# Patient Record
Sex: Female | Born: 1995 | Race: Black or African American | Hispanic: No | Marital: Single | Smoking: Former smoker
Health system: Southern US, Community
[De-identification: ages and names within clinical notes are randomized; demographics above are authoritative.]

## PROBLEM LIST (undated history)

## (undated) DIAGNOSIS — T7840XA Allergy, unspecified, initial encounter: Secondary | ICD-10-CM

## (undated) DIAGNOSIS — K219 Gastro-esophageal reflux disease without esophagitis: Secondary | ICD-10-CM

## (undated) DIAGNOSIS — B977 Papillomavirus as the cause of diseases classified elsewhere: Secondary | ICD-10-CM

## (undated) DIAGNOSIS — Z8742 Personal history of other diseases of the female genital tract: Secondary | ICD-10-CM

## (undated) DIAGNOSIS — Z789 Other specified health status: Secondary | ICD-10-CM

## (undated) HISTORY — DX: Allergy, unspecified, initial encounter: T78.40XA

## (undated) HISTORY — PX: NO PAST SURGERIES: SHX2092

## (undated) HISTORY — DX: Other specified health status: Z78.9

---
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
## 2017-01-06 ENCOUNTER — Ambulatory Visit: Payer: Self-pay | Admitting: Pediatrics

## 2017-03-13 ENCOUNTER — Ambulatory Visit: Payer: Self-pay | Admitting: Pediatrics

## 2017-03-27 ENCOUNTER — Emergency Department (HOSPITAL_COMMUNITY)
Admission: EM | Admit: 2017-03-27 | Discharge: 2017-03-27 | Disposition: A | Discharge status: Home / Self Care | Payer: Self-pay | Attending: Emergency Medicine | Admitting: Emergency Medicine

## 2017-03-27 ENCOUNTER — Encounter (HOSPITAL_COMMUNITY): Payer: Self-pay | Admitting: Emergency Medicine

## 2017-03-27 DIAGNOSIS — Y999 Unspecified external cause status: Secondary | ICD-10-CM | POA: Insufficient documentation

## 2017-03-27 DIAGNOSIS — Y939 Activity, unspecified: Secondary | ICD-10-CM | POA: Insufficient documentation

## 2017-03-27 DIAGNOSIS — W0110XA Fall on same level from slipping, tripping and stumbling with subsequent striking against unspecified object, initial encounter: Secondary | ICD-10-CM | POA: Insufficient documentation

## 2017-03-27 DIAGNOSIS — Z7722 Contact with and (suspected) exposure to environmental tobacco smoke (acute) (chronic): Secondary | ICD-10-CM | POA: Insufficient documentation

## 2017-03-27 DIAGNOSIS — Y929 Unspecified place or not applicable: Secondary | ICD-10-CM | POA: Insufficient documentation

## 2017-03-27 DIAGNOSIS — R55 Syncope and collapse: Secondary | ICD-10-CM | POA: Insufficient documentation

## 2017-03-27 DIAGNOSIS — S0181XA Laceration without foreign body of other part of head, initial encounter: Secondary | ICD-10-CM | POA: Insufficient documentation

## 2017-03-27 HISTORY — DX: Personal history of other diseases of the female genital tract: Z87.42

## 2017-03-27 LAB — I-STAT BETA HCG BLOOD, ED (MC, WL, AP ONLY): I-stat hCG, quantitative: <5 m[IU]/mL (ref <5)

## 2017-03-27 LAB — I-STAT CHEM 8, ED
BUN: 6 mg/dL (ref 6–20)
CALCIUM ION: 1.15 mmol/L (ref 1.15–1.40)
CREATININE: 0.7 mg/dL (ref 0.44–1.00)
Chloride: 105 mmol/L (ref 101–111)
GLUCOSE: 88 mg/dL (ref 65–99)
HCT: 34 % — ABNORMAL LOW (ref 36.0–46.0)
HEMOGLOBIN: 11.6 g/dL — AB (ref 12.0–15.0)
Potassium: 3.8 mmol/L (ref 3.5–5.1)
Sodium: 140 mmol/L (ref 135–145)
TCO2: 25 mmol/L (ref 22–32)

## 2017-03-27 MED ORDER — LIDOCAINE-EPINEPHRINE-TETRACAINE (LET) SOLUTION
3.0000 mL | Freq: Once | NASAL | Status: AC
  Administered 2017-03-27: 3 mL via TOPICAL
  Filled 2017-03-27: qty 3

## 2017-03-27 NOTE — ED Provider Notes (Signed)
[facility]-EMERGENCY DEPT Provider Note   CSN: [id number] Arrival date & time: 03/27/17  1557     History   Chief Complaint Chief Complaint  Patient presents with  . Loss of Consciousness    HPI Victoria Turner is a 21 y.o. female.  Patient had not eaten all day today became dizzy and passed out hit her chin.   The history is provided by the patient.  Loss of Consciousness   This is a new problem. The current episode started less than 1 hour ago. The problem occurs rarely. The problem has been resolved. She lost consciousness for a period of less than one minute. The problem is associated with normal activity. Associated symptoms include dizziness and visual change. Pertinent negatives include abdominal pain, back pain, chest pain, congestion, headaches and seizures.    Past Medical History:  Diagnosis Date  . Allergy    seasonal allergies  . History of heavy periods   . Medical history non-contributory     Patient Active Problem List   Diagnosis Date Noted  . UTI (lower urinary tract infection) 08/15/2015  . High risk sexual behavior 08/15/2015  . 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 Foster care (status) 01/12/2014  . Other seasonal allergic rhinitis 01/12/2014    History reviewed. No pertinent surgical history.  OB History    Gravida Para Term Preterm AB Living   0 0 0 0 0 0   SAB TAB Ectopic Multiple Live Births   0 0 0 0 0       Home Medications    Prior to Admission medications   Not on File    Family History Family History  Problem Relation Age of Onset  . Hypertension Mother   . Autism Sister   . Autism Brother   . Autism Sister   . Autism Sister   . ADD / ADHD Sister     Social History Social History  Substance Use Topics  . Smoking status: Passive Smoke Exposure - Never Smoker  . Smokeless tobacco: Never Used  . Alcohol use No     Allergies   Pollen extract   Review of Systems Review of Systems  Constitutional:  Negative for appetite change and fatigue.  HENT: Negative for congestion, ear discharge and sinus pressure.   Eyes: Negative for discharge.  Respiratory: Negative for cough.   Cardiovascular: Positive for syncope. Negative for chest pain.  Gastrointestinal: Negative for abdominal pain and diarrhea.  Genitourinary: Negative for frequency and hematuria.  Musculoskeletal: Negative for back pain.  Skin: Negative for rash.  Neurological: Positive for dizziness. Negative for seizures and headaches.  Psychiatric/Behavioral: Negative for hallucinations.     Physical Exam Updated Vital Signs BP 99/63   Pulse 87   Temp 98.7 F (37.1 C) (Oral)   Resp (!) 26   Ht  (1.575 m)   Wt 47.6 kg (105 lb)   LMP 03/27/2017   SpO2 98%   BMI 19.20 kg/m   Physical Exam  Constitutional: She is oriented to person, place, and time. She appears well-developed.  HENT:  Head: Normocephalic.  Eyes: Conjunctivae and EOM are normal. No scleral icterus.  Neck: Neck supple. No thyromegaly present.  Cardiovascular: Normal rate and regular rhythm.  Exam reveals no gallop and no friction rub.   No murmur heard. Pulmonary/Chest: No stridor. She has no wheezes. She has no rales. She exhibits no tenderness.  Abdominal: She exhibits no distension. There is no tenderness. There is no rebound.  Musculoskeletal: Normal range of motion. She exhibits no edema.  Lymphadenopathy:    She has no cervical adenopathy.  Neurological: She is oriented to person, place, and time. She exhibits normal muscle tone. Coordination normal.  Skin: No rash noted. No erythema.  2 cm superficial laceration to the chin  Psychiatric: She has a normal mood and affect. Her behavior is normal.     ED Treatments / Results  Labs (all labs ordered are listed, but only abnormal results are displayed) Labs Reviewed  I-STAT CHEM 8, ED - Abnormal; Notable for the following:       Result Value   Hemoglobin 11.6 (*)    HCT 34.0 (*)    All  other components within normal limits  I-STAT BETA HCG BLOOD, ED ([facility], [facility], [facility] ONLY)    EKG  EKG Interpretation None       Radiology No results found.  Procedures .Marland KitchenLaceration Repair Date/Time: 03/27/2017 5:56 PM Performed by: Bethann Berkshire Authorized by: Bethann Berkshire   Consent:    Consent obtained:  Verbal   Consent given by:  Patient   Risks discussed:  Pain   Alternatives discussed:  No treatment Anesthesia (see MAR for exact dosages):    Anesthesia method:  Topical application   Topical anesthetic:  LET Laceration details:    Length (cm):  2 Repair type:    Repair type:  Simple Pre-procedure details:    Preparation:  Patient was prepped and draped in usual sterile fashion Exploration:    Hemostasis achieved with:  LET   Contaminated: no   Treatment:    Area cleansed with:  Betadine Skin repair:    Repair method:  Sutures   Suture size:  6-0   Suture material:  Nylon   Suture technique:  Simple interrupted Approximation:    Approximation:  Close Post-procedure details:    Dressing:  Open (no dressing) Comments:     Patient has a 2 cm laceration to her chin that superficial. Area was cleaned thoroughly with Betadine. She was anesthetized with let. 5 sutures were used to close laceration. There were 60 sutures. The patient tolerated the procedure well will have the stitches removed in 5 days   (including critical care time)  Medications Ordered in ED Medications  lidocaine-EPINEPHrine-tetracaine (LET) solution (3 mLs Topical Given 03/27/17 1630)     Initial Impression / Assessment and Plan / ED Course  I have reviewed the triage vital signs and the nursing notes.  Pertinent labs & imaging results that were available during my care of the patient were reviewed by me and considered in my medical decision making (see chart for details).     Fall with 2 cm lac  Final Clinical Impressions(s) / ED Diagnoses   Final diagnoses:  Syncope and collapse     New Prescriptions New Prescriptions   No medications on file     Bethann Berkshire, MD 03/27/17 1758

## 2017-03-27 NOTE — ED Triage Notes (Signed)
Pt states was standing. Became weak and passed out. Pt has lac noted to chin with bleeding controlled. Pt c/o pain to chin and left side of neck. A/o. States this has happened before when she was on her period bc she bleeds heavily. Pt states is on her cycle now also.

## 2017-03-27 NOTE — Discharge Instructions (Signed)
Medication radiates up to 3 times a day. Follow-up in 5 days with her family doctor or here to have his sutures out. Clean laceration twice a day gently with soap and water

## 2017-04-15 ENCOUNTER — Ambulatory Visit: Payer: Medicaid Other

## 2017-04-15 NOTE — Progress Notes (Deleted)
Adolescent Well Care Visit Victoria Turner. female who is here for well care.     PCP:  Gregor Hams, NP   History was provided by the {CHL AMB PERSONS; PED RELATIVES/OTHER W/PATIENT:[phone]}.  Confidentiality was discussed with the patient and, if applicable, with caregiver as well. Patient's personal or confidential phone number: ***   Current Issues: Current concerns include ***.    Had ER visit on 03/27/2017 after passing out and hitting her chin. Had not eaten all day at time of syncope. LOC <35min. Hb 11.6. BMP wnl. Gluc 88. Chin sutured. EKG HR 102, RsR1, QTC 430, otherwise wnl. Sutures removed 9/27.  Last routine visit 08/2015; was treated for UTI then and discussed birth control at that time, but didn't decide on option.  Patient Active Problem List   Diagnosis Date Noted  . High risk sexual behavior 08/15/2015  . 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 Foster care (status) 01/12/2014  . Other seasonal allergic rhinitis 01/12/2014    Nutrition: Nutrition/Eating Behaviors: *** Adequate calcium in diet?: *** Supplements/ Vitamins: ***  Exercise/ Media: Play any Sports?:  {Misc; sports:10024} Exercise:  {Exercise:23478} Screen Time:  {[facility] AMB SCREEN TIME:[phone]} Media Rules or Monitoring?: {YES NO:22349}  Sleep:  Sleep: ***  Social Screening: Lives with:  *** Parental relations:  {CHL AMB PED FAM RELATIONSHIPS:[phone]} Activities, Work, and Regulatory affairs officer?: *** Concerns regarding behavior with peers?  {yes***/no:17258} Stressors of note: {Responses; yes**/no:17258}  Education: School Name: ***  School Grade: *** School performance: {performance:16655} School Behavior: {misc; parental coping:16655}  Menstruation:   Patient's last menstrual period was 03/27/2017. Menstrual History: ***   Patient has a dental home: {yes/no***:64::"yes"}   Confidential social history: Tobacco?  {YES/NO/WILD CARDS:18581} Secondhand smoke  exposure?  {YES/NO/WILD ZOXWR:60454} Drugs/ETOH?  {YES/NO/WILD UJWJX:91478}  Sexually Active?  {YES [id number]   Pregnancy Prevention: ***  Safe at home, in school & in relationships?  {Yes or If no, why not?:20788} Safe to self?  {Yes or If no, why not?:20788}   Screenings:  The patient completed the Rapid Assessment for Adolescent Preventive Services screening questionnaire and the following topics were identified as risk factors and discussed: {CHL AMB ASSESSMENT TOPICS:21012045}  In addition, the following topics were discussed as part of anticipatory guidance {CHL AMB ASSESSMENT TOPICS:21012045}.  PHQ-9 completed and results indicated ***  Physical Exam:  There were no vitals filed for this visit. LMP 03/27/2017  Body mass index: body mass index is unknown because there is no height or weight on file. No blood pressure reading on file for this encounter.  No exam data present  Physical Exam  3rd and 4th fingers of both hands with some lateral deviation and webbing between. Valgus deviation at elbows?  Assessment and Plan:   ***  BMI {ACTION; IS/IS GNF:[id number]} appropriate for age  Hearing screening result:{normal/abnormal/not examined:14677} Vision screening result: {normal/abnormal/not examined:14677}  Counseling provided for {CHL AMB PED VACCINE COUNSELING:210130100} vaccine components No orders of the defined types were placed in this encounter.    No Follow-up on file.Annell Greening, MD

## 2017-11-01 ENCOUNTER — Other Ambulatory Visit: Payer: Self-pay

## 2017-11-01 ENCOUNTER — Emergency Department (HOSPITAL_COMMUNITY)
Admission: EM | Admit: 2017-11-01 | Discharge: 2017-11-01 | Disposition: A | Discharge status: Home / Self Care | Payer: Self-pay | Attending: Emergency Medicine | Admitting: Emergency Medicine

## 2017-11-01 ENCOUNTER — Encounter (HOSPITAL_COMMUNITY): Payer: Self-pay | Admitting: *Deleted

## 2017-11-01 DIAGNOSIS — Z7722 Contact with and (suspected) exposure to environmental tobacco smoke (acute) (chronic): Secondary | ICD-10-CM | POA: Insufficient documentation

## 2017-11-01 DIAGNOSIS — J020 Streptococcal pharyngitis: Secondary | ICD-10-CM | POA: Insufficient documentation

## 2017-11-01 DIAGNOSIS — H538 Other visual disturbances: Secondary | ICD-10-CM | POA: Insufficient documentation

## 2017-11-01 DIAGNOSIS — R197 Diarrhea, unspecified: Secondary | ICD-10-CM | POA: Insufficient documentation

## 2017-11-01 DIAGNOSIS — R109 Unspecified abdominal pain: Secondary | ICD-10-CM | POA: Insufficient documentation

## 2017-11-01 LAB — COMPREHENSIVE METABOLIC PANEL
ALBUMIN: 3.7 g/dL (ref 3.5–5.0)
ALK PHOS: 56 U/L (ref 38–126)
ALT: 9 U/L — ABNORMAL LOW (ref 14–54)
ANION GAP: 7 (ref 5–15)
AST: 15 U/L (ref 15–41)
BILIRUBIN TOTAL: 1 mg/dL (ref 0.3–1.2)
BUN: 9 mg/dL (ref 6–20)
CALCIUM: 8.6 mg/dL — AB (ref 8.9–10.3)
CO2: 23 mmol/L (ref 22–32)
Chloride: 105 mmol/L (ref 101–111)
Creatinine, Ser: 0.71 mg/dL (ref 0.44–1.00)
GFR calc Af Amer: >60 mL/min (ref >60)
GFR calc non Af Amer: >60 mL/min (ref >60)
GLUCOSE: 92 mg/dL (ref 65–99)
Potassium: 3.9 mmol/L (ref 3.5–5.1)
SODIUM: 135 mmol/L (ref 135–145)
Total Protein: 6.5 g/dL (ref 6.5–8.1)

## 2017-11-01 LAB — URINALYSIS, ROUTINE W REFLEX MICROSCOPIC
Bilirubin Urine: NEGATIVE
Glucose, UA: NEGATIVE mg/dL
Ketones, ur: 80 mg/dL — AB
Leukocytes, UA: NEGATIVE
Nitrite: NEGATIVE
PROTEIN: 30 mg/dL — AB
RBC / HPF: >50 RBC/hpf — ABNORMAL HIGH (ref 0–5)
SPECIFIC GRAVITY, URINE: 1.026 (ref 1.005–1.030)
pH: 5 (ref 5.0–8.0)

## 2017-11-01 LAB — CBC
HEMATOCRIT: 35.2 % — AB (ref 36.0–46.0)
HEMOGLOBIN: 11.3 g/dL — AB (ref 12.0–15.0)
MCH: 30.2 pg (ref 26.0–34.0)
MCHC: 32.1 g/dL (ref 30.0–36.0)
MCV: 94.1 fL (ref 78.0–100.0)
Platelets: 241 10*3/uL (ref 150–400)
RBC: 3.74 MIL/uL — ABNORMAL LOW (ref 3.87–5.11)
RDW: 14.7 % (ref 11.5–15.5)
WBC: 9.4 10*3/uL (ref 4.0–10.5)

## 2017-11-01 LAB — GROUP A STREP BY PCR: GROUP A STREP BY PCR: DETECTED — AB

## 2017-11-01 LAB — LIPASE, BLOOD: Lipase: 23 U/L (ref 11–51)

## 2017-11-01 MED ORDER — PENICILLIN G BENZATHINE 1200000 UNIT/2ML IM SUSP
1.2000 10*6.[IU] | Freq: Once | INTRAMUSCULAR | Status: AC
  Administered 2017-11-01: 1.2 10*6.[IU] via INTRAMUSCULAR
  Filled 2017-11-01: qty 2

## 2017-11-01 MED ORDER — PENICILLIN G BENZATHINE & PROC 1200000 UNIT/2ML IM SUSP
1.2000 10*6.[IU] | Freq: Once | INTRAMUSCULAR | Status: DC
  Filled 2017-11-01: qty 2

## 2017-11-01 NOTE — ED Provider Notes (Signed)
[facility] EMERGENCY DEPARTMENT Provider Note   CSN: [id number] Arrival date & time: 11/01/17  0636     History   Chief Complaint Chief Complaint  Patient presents with  . Generalized Body Aches    HPI Victoria Turner is a 22 y.o. female.  HPI   Victoria Turner is a [place] female with a history of allergies and iron deficiency anemia who presents to the emergency department for evaluation of sore throat and abdominal pain.  Patient reports that she developed sore throat this morning.  States pain is 4/10 in severity and feels sharp in nature.  It is acutely worsened with swallowing or turning the neck.  She states that when she woke up today she also felt as if her vision was blurry and had one episode of nonbloody diarrhea.  She reports that the blurry vision lasted for about 5 minutes and has resolved, adds that "I need glasses."  Denies any further diarrhea.  States that she also has 3/10 severity periumbilical pain which feels aching in nature and does not radiate.  She has not taken any over-the-counter medications for her symptoms.  States that she started her menstrual cycle 3 days ago.  Denies fever, chills dysphagia, neck pain, headache, numbness, weakness, nausea/vomiting, dysuria, urinary frequency, flank pain, chest pain, shortness of breath, lightheadedness or syncope.  She denies prior abdominal surgeries.  Past Medical History:  Diagnosis Date  . Allergy    seasonal allergies  . History of heavy periods   . Medical history non-contributory     Patient Active Problem List   Diagnosis Date Noted  . High risk sexual behavior 08/15/2015  . 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 Foster care (status) 01/12/2014  . Other seasonal allergic rhinitis 01/12/2014    History reviewed. No pertinent surgical history.   OB History    Gravida  0   Para  0   Term  0   Preterm  0   AB  0   Living  0     SAB  0   TAB  0   Ectopic    0   Multiple  0   Live Births  0            Home Medications    Prior to Admission medications   Not on File    Family History Family History  Problem Relation Age of Onset  . Hypertension Mother   . Autism Sister   . Autism Brother   . Autism Sister   . Autism Sister   . ADD / ADHD Sister     Social History Social History   Tobacco Use  . Smoking status: Passive Smoke Exposure - Never Smoker  . Smokeless tobacco: Never Used  Substance Use Topics  . Alcohol use: No  . Drug use: No     Allergies   Pollen extract   Review of Systems Review of Systems  Constitutional: Negative for chills and fever.  HENT: Positive for sore throat. Negative for congestion, postnasal drip and trouble swallowing.   Eyes: Negative for visual disturbance.  Respiratory: Negative for shortness of breath.   Cardiovascular: Negative for chest pain.  Gastrointestinal: Positive for abdominal pain (periumbilical) and diarrhea (earlier this morning, resolved). Negative for blood in stool, nausea and vomiting.  Genitourinary: Positive for vaginal bleeding. Negative for difficulty urinating, dysuria, flank pain, frequency and vaginal discharge.  Musculoskeletal: Negative for gait problem, neck pain and neck stiffness.  Skin: Negative for rash.  Neurological: Negative for syncope, speech difficulty, weakness, light-headedness, numbness and headaches.  Psychiatric/Behavioral: Negative for agitation.     Physical Exam Updated Vital Signs BP 92/69   Pulse 98   Temp 97.8 F (36.6 C) (Oral)   Resp 18   Ht  (1.575 m)   Wt 45.4 kg (100 lb)   LMP 11/01/2017   SpO2 100%   BMI 18.29 kg/m   Physical Exam  Constitutional: She is oriented to person, place, and time. She appears well-developed and well-nourished. No distress.  Sitting at bedside in no apparent distress, non-toxic.   HENT:  Head: Normocephalic and atraumatic.  Mucous membranes moist.  Posterior oropharynx  erythematous.  3+ tonsillar edema bilaterally, exudate present.  Uvula midline.  Airway patent and able to handle oral secretions.  Eyes: Pupils are equal, round, and reactive to light. Conjunctivae are normal. Right eye exhibits no discharge. Left eye exhibits no discharge.  Neck: Normal range of motion. Neck supple.  Cardiovascular: Normal rate, regular rhythm and intact distal pulses. Exam reveals no friction rub.  No murmur heard. Pulmonary/Chest: Effort normal and breath sounds normal. No stridor. No respiratory distress. She has no wheezes. She has no rales.  Abdominal:  Abdomen soft and nondistended.  Nontender to palpation.   Musculoskeletal: Normal range of motion.  Lymphadenopathy:    She has no cervical adenopathy.  Neurological: She is alert and oriented to person, place, and time. Coordination normal.  Skin: Skin is warm and dry. Capillary refill takes less than 2 seconds. She is not diaphoretic.  Psychiatric: She has a normal mood and affect. Her behavior is normal.  Nursing note and vitals reviewed.    ED Treatments / Results  Labs (all labs ordered are listed, but only abnormal results are displayed) Labs Reviewed  GROUP A STREP BY PCR - Abnormal; Notable for the following components:      Result Value   Group A Strep by PCR DETECTED (*)    All other components within normal limits  COMPREHENSIVE METABOLIC PANEL - Abnormal; Notable for the following components:   Calcium 8.6 (*)    ALT 9 (*)    All other components within normal limits  CBC - Abnormal; Notable for the following components:   RBC 3.74 (*)    Hemoglobin 11.3 (*)    HCT 35.2 (*)    All other components within normal limits  URINALYSIS, ROUTINE W REFLEX MICROSCOPIC - Abnormal; Notable for the following components:   APPearance HAZY (*)    Hgb urine dipstick LARGE (*)    Ketones, ur 80 (*)    Protein, ur 30 (*)    RBC / HPF >50 (*)    Bacteria, UA RARE (*)    All other components within normal  limits  LIPASE, BLOOD    EKG None  Radiology No results found.  Procedures Procedures (including critical care time)  Medications Ordered in ED Medications  penicillin g procaine-penicillin g benzathine (BICILLIN-CR) injection 600000-600000 units (has no administration in time range)     Initial Impression / Assessment and Plan / ED Course  I have reviewed the triage vital signs and the nursing notes.  Pertinent labs & imaging results that were available during my care of the patient were reviewed by me and considered in my medical decision making (see chart for details).     Rapid strep test positive.  Patient treated in the emergency department with penicillin G.  Patient counseled to  use Tylenol and NSAIDs at home for pain.  No signs of dehydration, but did counsel her to drink plenty of fluids at home.  No trismus or uvula deviation, no concern for RPA or PTA.  In terms of her periumbilical pain, this is likely related to strep throat.  On exam she is nontender to palpation.  Labs reviewed which are largely unremarkable.  CBC without leukocytosis, she is mildly anemic although this is chronic for her and her hemoglobin is baseline.  UA reveals many red blood cells, likely contaminated from her menstrual cycle.  No white blood cells and rare bacteria, doubt UTI.  CMP without any major electrode abnormalities, kidney function normal and liver enzymes normal.  Do not think CT imaging is indicated at this time given exam and presentation.  Patient's symptoms of brief period of blurry vision today likely related to the fact that she does not have glasses as she reports.  She denies blurry vision, painful eye or headache at this time.  Counseled her on return precautions and she agrees and voiced understanding to the above plan.  Final Clinical Impressions(s) / ED Diagnoses   Final diagnoses:  Strep pharyngitis    ED Discharge Orders    None       Lawrence Marseilles 11/01/17 1204    Lorre Nick, MD 11/02/17 0800

## 2017-11-01 NOTE — ED Triage Notes (Signed)
THE PT WOKE UP THIS AM WITH A SORE THROAT DIFFICULTY BREATHING  NONE VISIBLE NOW BLURRED VISION NAUSEA AND DIARRHEA  LMP NOW

## 2017-11-01 NOTE — ED Notes (Signed)
Pt verbalized understanding of d/c instructions and has no further questions, VSS, NAD.  

## 2017-11-01 NOTE — Discharge Instructions (Addendum)
You were treated for strep throat today.  As we discussed you can take Tylenol and ibuprofen at home for pain.  You can also use over-the-counter throat lozenges and warm liquids to help soothe the throat.  Return to the emergency department if you have any new or concerning symptoms like worsening abdominal pain, vomiting that does not stop, trouble breathing or cannot swallow.

## 2017-11-02 ENCOUNTER — Telehealth: Payer: Self-pay | Admitting: Emergency Medicine

## 2017-11-02 NOTE — Telephone Encounter (Signed)
Post ED Visit - Positive Culture Follow-up  Culture report reviewed by antimicrobial stewardship pharmacist:   Enzo Bi, Pharm.D.  Celedonio Miyamoto, Pharm.D., BCPS AQ-ID  Garvin Fila, Pharm.D., BCPS  Georgina Pillion, Pharm.D., BCPS  [place], [address].D., BCPS, AAHIVP  Estella Husk, Pharm.D., BCPS, AAHIVP  Lysle Pearl, PharmD, BCPS  Blake Divine, PharmD  Pollyann Samples, PharmD, BCPS  Positive strep culture Treated with PCN, organism sensitive to the same and no further patient follow-up is required at this time.  Berle Mull 11/02/2017, 1:28 PM

## 2018-09-02 ENCOUNTER — Telehealth: Payer: Self-pay | Admitting: Family Medicine

## 2018-09-02 ENCOUNTER — Encounter (HOSPITAL_COMMUNITY): Payer: Self-pay

## 2018-09-02 ENCOUNTER — Encounter (HOSPITAL_COMMUNITY)
Admission: AD | Disposition: A | Discharge status: Home / Self Care | Payer: Self-pay | Source: Home / Self Care | Attending: Obstetrics and Gynecology

## 2018-09-02 ENCOUNTER — Inpatient Hospital Stay (HOSPITAL_COMMUNITY): Payer: Medicaid Other | Admitting: Anesthesiology

## 2018-09-02 ENCOUNTER — Inpatient Hospital Stay (HOSPITAL_COMMUNITY)
Admission: AD | Admit: 2018-09-02 | Discharge: 2018-09-06 | DRG: 787 | Disposition: A | Discharge status: Home / Self Care | Payer: Medicaid Other | Attending: Obstetrics and Gynecology | Admitting: Obstetrics and Gynecology

## 2018-09-02 DIAGNOSIS — K567 Ileus, unspecified: Secondary | ICD-10-CM | POA: Diagnosis not present

## 2018-09-02 DIAGNOSIS — O9081 Anemia of the puerperium: Secondary | ICD-10-CM | POA: Diagnosis not present

## 2018-09-02 DIAGNOSIS — A6000 Herpesviral infection of urogenital system, unspecified: Secondary | ICD-10-CM | POA: Diagnosis present

## 2018-09-02 DIAGNOSIS — O1413 Severe pre-eclampsia, third trimester: Secondary | ICD-10-CM

## 2018-09-02 DIAGNOSIS — O9832 Other infections with a predominantly sexual mode of transmission complicating childbirth: Secondary | ICD-10-CM | POA: Diagnosis present

## 2018-09-02 DIAGNOSIS — O9963 Diseases of the digestive system complicating the puerperium: Secondary | ICD-10-CM | POA: Diagnosis not present

## 2018-09-02 DIAGNOSIS — Z3A36 36 weeks gestation of pregnancy: Secondary | ICD-10-CM | POA: Diagnosis not present

## 2018-09-02 DIAGNOSIS — O159 Eclampsia, unspecified as to time period: Secondary | ICD-10-CM

## 2018-09-02 DIAGNOSIS — O152 Eclampsia in the puerperium: Secondary | ICD-10-CM | POA: Diagnosis not present

## 2018-09-02 DIAGNOSIS — O151 Eclampsia in labor: Secondary | ICD-10-CM

## 2018-09-02 DIAGNOSIS — O1414 Severe pre-eclampsia complicating childbirth: Secondary | ICD-10-CM | POA: Diagnosis present

## 2018-09-02 DIAGNOSIS — O1424 HELLP syndrome, complicating childbirth: Secondary | ICD-10-CM | POA: Diagnosis present

## 2018-09-02 DIAGNOSIS — D62 Acute posthemorrhagic anemia: Secondary | ICD-10-CM | POA: Diagnosis not present

## 2018-09-02 HISTORY — DX: Eclampsia, unspecified as to time period: O15.9

## 2018-09-02 HISTORY — DX: Severe pre-eclampsia, third trimester: O14.13

## 2018-09-02 HISTORY — DX: Gastro-esophageal reflux disease without esophagitis: K21.9

## 2018-09-02 HISTORY — DX: Papillomavirus as the cause of diseases classified elsewhere: B97.7

## 2018-09-02 LAB — WET PREP, GENITAL
Clue Cells Wet Prep HPF POC: NONE SEEN
Sperm: NONE SEEN
Trich, Wet Prep: NONE SEEN
Yeast Wet Prep HPF POC: NONE SEEN

## 2018-09-02 LAB — RAPID URINE DRUG SCREEN, HOSP PERFORMED
Amphetamines: NOT DETECTED
Barbiturates: NOT DETECTED
Benzodiazepines: NOT DETECTED
Cocaine: NOT DETECTED
Opiates: NOT DETECTED
TETRAHYDROCANNABINOL: NOT DETECTED

## 2018-09-02 LAB — COMPREHENSIVE METABOLIC PANEL
ALT: 96 U/L — ABNORMAL HIGH (ref 0–44)
AST: 139 U/L — ABNORMAL HIGH (ref 15–41)
Albumin: 2.6 g/dL — ABNORMAL LOW (ref 3.5–5.0)
Alkaline Phosphatase: 87 U/L (ref 38–126)
Anion gap: 9 (ref 5–15)
BUN: 11 mg/dL (ref 6–20)
CO2: 19 mmol/L — ABNORMAL LOW (ref 22–32)
CREATININE: 0.65 mg/dL (ref 0.44–1.00)
Calcium: 8.7 mg/dL — ABNORMAL LOW (ref 8.9–10.3)
Chloride: 109 mmol/L (ref 98–111)
GFR calc non Af Amer: >60 mL/min (ref >60)
Glucose, Bld: 82 mg/dL (ref 70–99)
Potassium: 3.8 mmol/L (ref 3.5–5.1)
Sodium: 137 mmol/L (ref 135–145)
Total Bilirubin: 0.6 mg/dL (ref 0.3–1.2)
Total Protein: 6.1 g/dL — ABNORMAL LOW (ref 6.5–8.1)

## 2018-09-02 LAB — CBC
HCT: 38.6 % (ref 36.0–46.0)
Hemoglobin: 13.1 g/dL (ref 12.0–15.0)
MCH: 32.8 pg (ref 26.0–34.0)
MCHC: 33.9 g/dL (ref 30.0–36.0)
MCV: 96.7 fL (ref 80.0–100.0)
NRBC: 0.2 % (ref 0.0–0.2)
Platelets: 131 10*3/uL — ABNORMAL LOW (ref 150–400)
RBC: 3.99 MIL/uL (ref 3.87–5.11)
RDW: 12.6 % (ref 11.5–15.5)
WBC: 10.8 10*3/uL — ABNORMAL HIGH (ref 4.0–10.5)

## 2018-09-02 LAB — ABO/RH: ABO/RH(D): A POS

## 2018-09-02 LAB — PROTEIN / CREATININE RATIO, URINE
Creatinine, Urine: 111.93 mg/dL
Protein Creatinine Ratio: 22.36 mg/mg{Cre} — ABNORMAL HIGH (ref 0.00–0.15)
Total Protein, Urine: 2503 mg/dL

## 2018-09-02 LAB — GROUP B STREP BY PCR: Group B strep by PCR: NEGATIVE

## 2018-09-02 SURGERY — Surgical Case
Anesthesia: General

## 2018-09-02 MED ORDER — LABETALOL HCL 5 MG/ML IV SOLN
INTRAVENOUS | Status: AC
  Filled 2018-09-02: qty 4

## 2018-09-02 MED ORDER — SCOPOLAMINE 1 MG/3DAYS TD PT72
MEDICATED_PATCH | TRANSDERMAL | Status: DC | PRN
  Administered 2018-09-02: 1 via TRANSDERMAL

## 2018-09-02 MED ORDER — LABETALOL HCL 5 MG/ML IV SOLN
80.0000 mg | INTRAVENOUS | Status: DC | PRN
  Administered 2018-09-02: 80 mg via INTRAVENOUS

## 2018-09-02 MED ORDER — ACETAMINOPHEN 10 MG/ML IV SOLN
1000.0000 mg | Freq: Once | INTRAVENOUS | Status: AC
  Administered 2018-09-02: 1000 mg via INTRAVENOUS

## 2018-09-02 MED ORDER — METOCLOPRAMIDE HCL 5 MG/ML IJ SOLN: 10.0000 mg | Freq: Once | INTRAMUSCULAR | Status: DC | PRN

## 2018-09-02 MED ORDER — MIDAZOLAM HCL 2 MG/2ML IJ SOLN
INTRAMUSCULAR | Status: AC
  Filled 2018-09-02: qty 2

## 2018-09-02 MED ORDER — LACTATED RINGERS IV SOLN
INTRAVENOUS | Status: DC
  Administered 2018-09-02: 17:00:00 via INTRAVENOUS

## 2018-09-02 MED ORDER — BUPIVACAINE HCL (PF) 0.5 % IJ SOLN
INTRAMUSCULAR | Status: AC
  Filled 2018-09-02: qty 30

## 2018-09-02 MED ORDER — OXYTOCIN 40 UNITS IN NORMAL SALINE INFUSION - SIMPLE MED: 2.5000 [IU]/h | INTRAVENOUS | Status: AC

## 2018-09-02 MED ORDER — BUTALBITAL-APAP-CAFFEINE 50-325-40 MG PO TABS
2.0000 | ORAL_TABLET | Freq: Once | ORAL | Status: AC
  Administered 2018-09-02: 2 via ORAL
  Filled 2018-09-02: qty 2

## 2018-09-02 MED ORDER — HYDRALAZINE HCL 20 MG/ML IJ SOLN: 10.0000 mg | INTRAMUSCULAR | Status: DC | PRN

## 2018-09-02 MED ORDER — LABETALOL HCL 5 MG/ML IV SOLN: 40.0000 mg | INTRAVENOUS | Status: DC | PRN

## 2018-09-02 MED ORDER — DIBUCAINE 1 % RE OINT: 1.0000 "application " | TOPICAL_OINTMENT | RECTAL | Status: DC | PRN

## 2018-09-02 MED ORDER — LABETALOL HCL 5 MG/ML IV SOLN: 20.0000 mg | INTRAVENOUS | Status: DC | PRN

## 2018-09-02 MED ORDER — LIDOCAINE HCL (PF) 1 % IJ SOLN: 30.0000 mL | INTRAMUSCULAR | Status: DC | PRN

## 2018-09-02 MED ORDER — MISOPROSTOL 25 MCG QUARTER TABLET
25.0000 ug | ORAL_TABLET | ORAL | Status: DC
  Filled 2018-09-02 (×7): qty 1

## 2018-09-02 MED ORDER — SCOPOLAMINE 1 MG/3DAYS TD PT72
MEDICATED_PATCH | TRANSDERMAL | Status: AC
  Filled 2018-09-02: qty 1

## 2018-09-02 MED ORDER — LACTATED RINGERS IV SOLN
INTRAVENOUS | Status: DC
  Administered 2018-09-02: 20:00:00 via INTRAVENOUS

## 2018-09-02 MED ORDER — DIPHENHYDRAMINE HCL 25 MG PO CAPS: 25.0000 mg | ORAL_CAPSULE | Freq: Four times a day (QID) | ORAL | Status: DC | PRN

## 2018-09-02 MED ORDER — MAGNESIUM SULFATE 40 G IN LACTATED RINGERS - SIMPLE
2.0000 g/h | INTRAVENOUS | Status: DC
  Administered 2018-09-02: 2 g/h via INTRAVENOUS
  Filled 2018-09-02: qty 500

## 2018-09-02 MED ORDER — LACTATED RINGERS IV SOLN: 500.0000 mL | INTRAVENOUS | Status: DC | PRN

## 2018-09-02 MED ORDER — ACETAMINOPHEN 10 MG/ML IV SOLN
INTRAVENOUS | Status: AC
  Filled 2018-09-02: qty 100

## 2018-09-02 MED ORDER — LACTATED RINGERS IV SOLN
INTRAVENOUS | Status: DC
  Administered 2018-09-03 – 2018-09-04 (×3): via INTRAVENOUS

## 2018-09-02 MED ORDER — ZOLPIDEM TARTRATE 5 MG PO TABS: 5.0000 mg | ORAL_TABLET | Freq: Every evening | ORAL | Status: DC | PRN

## 2018-09-02 MED ORDER — CEFAZOLIN SODIUM-DEXTROSE 2-3 GM-%(50ML) IV SOLR
INTRAVENOUS | Status: DC | PRN
  Administered 2018-09-02: 2 g via INTRAVENOUS

## 2018-09-02 MED ORDER — SODIUM CHLORIDE 0.9% FLUSH: 9.0000 mL | INTRAVENOUS | Status: DC | PRN

## 2018-09-02 MED ORDER — ONDANSETRON HCL 4 MG/2ML IJ SOLN: 4.0000 mg | Freq: Four times a day (QID) | INTRAMUSCULAR | Status: DC | PRN

## 2018-09-02 MED ORDER — FENTANYL CITRATE (PF) 100 MCG/2ML IJ SOLN: 50.0000 ug | INTRAMUSCULAR | Status: DC | PRN

## 2018-09-02 MED ORDER — OXYCODONE-ACETAMINOPHEN 5-325 MG PO TABS: 1.0000 | ORAL_TABLET | ORAL | Status: DC | PRN

## 2018-09-02 MED ORDER — CEFAZOLIN SODIUM-DEXTROSE 2-4 GM/100ML-% IV SOLN
INTRAVENOUS | Status: AC
  Filled 2018-09-02: qty 100

## 2018-09-02 MED ORDER — MEASLES, MUMPS & RUBELLA VAC IJ SOLR
0.5000 mL | Freq: Once | INTRAMUSCULAR | Status: AC
  Administered 2018-09-06: 0.5 mL via SUBCUTANEOUS
  Filled 2018-09-02: qty 0.5

## 2018-09-02 MED ORDER — PENICILLIN G 3 MILLION UNITS IVPB - SIMPLE MED: 3.0000 10*6.[IU] | INTRAVENOUS | Status: DC

## 2018-09-02 MED ORDER — BETAMETHASONE SOD PHOS & ACET 6 (3-3) MG/ML IJ SUSP
12.0000 mg | INTRAMUSCULAR | Status: DC
  Filled 2018-09-02: qty 2

## 2018-09-02 MED ORDER — SENNOSIDES-DOCUSATE SODIUM 8.6-50 MG PO TABS
2.0000 | ORAL_TABLET | ORAL | Status: DC
  Administered 2018-09-03 (×2): 2 via ORAL
  Filled 2018-09-02 (×2): qty 2

## 2018-09-02 MED ORDER — TERBUTALINE SULFATE 1 MG/ML IJ SOLN: 0.2500 mg | Freq: Once | INTRAMUSCULAR | Status: DC | PRN

## 2018-09-02 MED ORDER — HYDROMORPHONE HCL 1 MG/ML IJ SOLN
0.2500 mg | INTRAMUSCULAR | Status: DC | PRN
  Administered 2018-09-02: 0.5 mg via INTRAVENOUS

## 2018-09-02 MED ORDER — NALOXONE HCL 0.4 MG/ML IJ SOLN: 0.4000 mg | INTRAMUSCULAR | Status: DC | PRN

## 2018-09-02 MED ORDER — OXYTOCIN 40 UNITS IN NORMAL SALINE INFUSION - SIMPLE MED
2.5000 [IU]/h | INTRAVENOUS | Status: DC
  Administered 2018-09-02: 2.5 [IU]/h via INTRAVENOUS

## 2018-09-02 MED ORDER — SIMETHICONE 80 MG PO CHEW
80.0000 mg | CHEWABLE_TABLET | Freq: Three times a day (TID) | ORAL | Status: DC
  Administered 2018-09-03 (×2): 80 mg via ORAL
  Filled 2018-09-02 (×2): qty 1

## 2018-09-02 MED ORDER — HYDRALAZINE HCL 20 MG/ML IJ SOLN
5.0000 mg | INTRAMUSCULAR | Status: DC | PRN
  Administered 2018-09-02: 5 mg via INTRAVENOUS
  Filled 2018-09-02: qty 1

## 2018-09-02 MED ORDER — SUCCINYLCHOLINE CHLORIDE 20 MG/ML IJ SOLN
INTRAMUSCULAR | Status: DC | PRN
  Administered 2018-09-02: 100 mg via INTRAVENOUS

## 2018-09-02 MED ORDER — ENALAPRIL MALEATE 5 MG PO TABS
5.0000 mg | ORAL_TABLET | Freq: Every day | ORAL | Status: DC
  Administered 2018-09-02 – 2018-09-06 (×5): 5 mg via ORAL
  Filled 2018-09-02 (×5): qty 1

## 2018-09-02 MED ORDER — BUPIVACAINE HCL (PF) 0.5 % IJ SOLN
INTRAMUSCULAR | Status: DC | PRN
  Administered 2018-09-02: 30 mL

## 2018-09-02 MED ORDER — LABETALOL HCL 5 MG/ML IV SOLN
20.0000 mg | INTRAVENOUS | Status: DC | PRN
  Administered 2018-09-02 – 2018-09-06 (×2): 20 mg via INTRAVENOUS
  Filled 2018-09-02: qty 4

## 2018-09-02 MED ORDER — MIDAZOLAM HCL 2 MG/2ML IJ SOLN
INTRAMUSCULAR | Status: DC | PRN
  Administered 2018-09-02: 2 mg via INTRAVENOUS

## 2018-09-02 MED ORDER — HYDRALAZINE HCL 20 MG/ML IJ SOLN
10.0000 mg | INTRAMUSCULAR | Status: DC | PRN
  Administered 2018-09-02: 10 mg via INTRAVENOUS
  Filled 2018-09-02: qty 1

## 2018-09-02 MED ORDER — SODIUM CHLORIDE 0.9 % IV SOLN: 5.0000 10*6.[IU] | Freq: Once | INTRAVENOUS | Status: DC

## 2018-09-02 MED ORDER — ACETAMINOPHEN 325 MG PO TABS: 650.0000 mg | ORAL_TABLET | ORAL | Status: DC | PRN

## 2018-09-02 MED ORDER — PROPOFOL 10 MG/ML IV BOLUS
INTRAVENOUS | Status: DC | PRN
  Administered 2018-09-02: 180 mg via INTRAVENOUS

## 2018-09-02 MED ORDER — SIMETHICONE 80 MG PO CHEW
80.0000 mg | CHEWABLE_TABLET | ORAL | Status: DC
  Administered 2018-09-03: 80 mg via ORAL
  Filled 2018-09-02: qty 1

## 2018-09-02 MED ORDER — PRENATAL MULTIVITAMIN CH
1.0000 | ORAL_TABLET | Freq: Every day | ORAL | Status: DC
  Administered 2018-09-03 – 2018-09-06 (×3): 1 via ORAL
  Filled 2018-09-02 (×3): qty 1

## 2018-09-02 MED ORDER — SUCCINYLCHOLINE CHLORIDE 200 MG/10ML IV SOSY
PREFILLED_SYRINGE | INTRAVENOUS | Status: AC
  Filled 2018-09-02: qty 10

## 2018-09-02 MED ORDER — DEXAMETHASONE SODIUM PHOSPHATE 4 MG/ML IJ SOLN
INTRAMUSCULAR | Status: AC
  Filled 2018-09-02: qty 1

## 2018-09-02 MED ORDER — COCONUT OIL OIL
1.0000 "application " | TOPICAL_OIL | Status: DC | PRN
  Administered 2018-09-06: 1 via TOPICAL

## 2018-09-02 MED ORDER — FENTANYL CITRATE (PF) 100 MCG/2ML IJ SOLN
INTRAMUSCULAR | Status: DC | PRN
  Administered 2018-09-02: 250 ug via INTRAVENOUS

## 2018-09-02 MED ORDER — OXYTOCIN BOLUS FROM INFUSION: 500.0000 mL | Freq: Once | INTRAVENOUS | Status: DC

## 2018-09-02 MED ORDER — OXYTOCIN 40 UNITS IN NORMAL SALINE INFUSION - SIMPLE MED
INTRAVENOUS | Status: DC | PRN
  Administered 2018-09-02: 40 mL via INTRAVENOUS

## 2018-09-02 MED ORDER — SOD CITRATE-CITRIC ACID 500-334 MG/5ML PO SOLN: 30.0000 mL | ORAL | Status: DC | PRN

## 2018-09-02 MED ORDER — ONDANSETRON HCL 4 MG/2ML IJ SOLN
INTRAMUSCULAR | Status: AC
  Filled 2018-09-02: qty 2

## 2018-09-02 MED ORDER — HYDRALAZINE HCL 20 MG/ML IJ SOLN
INTRAMUSCULAR | Status: AC
  Filled 2018-09-02: qty 1

## 2018-09-02 MED ORDER — HYDROMORPHONE 1 MG/ML IV SOLN
INTRAVENOUS | Status: DC
  Administered 2018-09-03: 2.4 mg via INTRAVENOUS
  Administered 2018-09-03: 0.2 mg via INTRAVENOUS
  Administered 2018-09-03: 0.8 mg via INTRAVENOUS
  Administered 2018-09-03: via INTRAVENOUS
  Administered 2018-09-03: 1.1 mg via INTRAVENOUS
  Administered 2018-09-03: 0.7 mg via INTRAVENOUS
  Administered 2018-09-04: 0.4 mg via INTRAVENOUS
  Administered 2018-09-04: 0.2 mg via INTRAVENOUS
  Filled 2018-09-02: qty 25

## 2018-09-02 MED ORDER — OXYTOCIN 40 UNITS IN NORMAL SALINE INFUSION - SIMPLE MED
INTRAVENOUS | Status: AC
  Filled 2018-09-02: qty 1000

## 2018-09-02 MED ORDER — LABETALOL HCL 5 MG/ML IV SOLN
40.0000 mg | INTRAVENOUS | Status: DC | PRN
  Administered 2018-09-02: 40 mg via INTRAVENOUS

## 2018-09-02 MED ORDER — ENOXAPARIN SODIUM 40 MG/0.4ML ~~LOC~~ SOLN
40.0000 mg | SUBCUTANEOUS | Status: DC
  Administered 2018-09-03: 40 mg via SUBCUTANEOUS
  Filled 2018-09-02: qty 0.4

## 2018-09-02 MED ORDER — ONDANSETRON HCL 4 MG/2ML IJ SOLN
INTRAMUSCULAR | Status: DC | PRN
  Administered 2018-09-02: 4 mg via INTRAVENOUS

## 2018-09-02 MED ORDER — WITCH HAZEL-GLYCERIN EX PADS: 1.0000 "application " | MEDICATED_PAD | CUTANEOUS | Status: DC | PRN

## 2018-09-02 MED ORDER — DEXAMETHASONE SODIUM PHOSPHATE 10 MG/ML IJ SOLN
INTRAMUSCULAR | Status: DC | PRN
  Administered 2018-09-02: 4 mg via INTRAVENOUS

## 2018-09-02 MED ORDER — PROPOFOL 10 MG/ML IV BOLUS
INTRAVENOUS | Status: AC
  Filled 2018-09-02: qty 20

## 2018-09-02 MED ORDER — FENTANYL CITRATE (PF) 250 MCG/5ML IJ SOLN
INTRAMUSCULAR | Status: AC
  Filled 2018-09-02: qty 5

## 2018-09-02 MED ORDER — SIMETHICONE 80 MG PO CHEW: 80.0000 mg | CHEWABLE_TABLET | ORAL | Status: DC | PRN

## 2018-09-02 MED ORDER — TETANUS-DIPHTH-ACELL PERTUSSIS 5-2.5-18.5 LF-MCG/0.5 IM SUSP: 0.5000 mL | Freq: Once | INTRAMUSCULAR | Status: DC

## 2018-09-02 MED ORDER — MENTHOL 3 MG MT LOZG
1.0000 | LOZENGE | OROMUCOSAL | Status: DC | PRN
  Administered 2018-09-03: 3 mg via ORAL
  Filled 2018-09-02: qty 9

## 2018-09-02 MED ORDER — HYDRALAZINE HCL 20 MG/ML IJ SOLN
10.0000 mg | INTRAMUSCULAR | Status: DC | PRN
  Administered 2018-09-02: 10 mg via INTRAVENOUS

## 2018-09-02 MED ORDER — PHENYLEPHRINE 40 MCG/ML (10ML) SYRINGE FOR IV PUSH (FOR BLOOD PRESSURE SUPPORT)
PREFILLED_SYRINGE | INTRAVENOUS | Status: AC
  Filled 2018-09-02: qty 10

## 2018-09-02 MED ORDER — LABETALOL HCL 5 MG/ML IV SOLN
80.0000 mg | INTRAVENOUS | Status: DC | PRN
  Filled 2018-09-02: qty 16

## 2018-09-02 MED ORDER — HYDROMORPHONE HCL 1 MG/ML IJ SOLN
INTRAMUSCULAR | Status: AC
  Filled 2018-09-02: qty 0.5

## 2018-09-02 MED ORDER — BUTALBITAL-APAP-CAFFEINE 50-325-40 MG PO TABS: 2.0000 | ORAL_TABLET | Freq: Four times a day (QID) | ORAL | Status: DC | PRN

## 2018-09-02 MED ORDER — MAGNESIUM SULFATE BOLUS VIA INFUSION
4.0000 g | Freq: Once | INTRAVENOUS | Status: AC
  Administered 2018-09-02: 4 g via INTRAVENOUS
  Filled 2018-09-02: qty 500

## 2018-09-02 MED ORDER — PHENYLEPHRINE HCL 10 MG/ML IJ SOLN
INTRAMUSCULAR | Status: DC | PRN
  Administered 2018-09-02: .08 mg via INTRAVENOUS
  Administered 2018-09-02: .12 mg via INTRAVENOUS

## 2018-09-02 SURGICAL SUPPLY — 37 items
BENZOIN TINCTURE PRP APPL 2/3 (GAUZE/BANDAGES/DRESSINGS) ×3 IMPLANT
BLADE TIP J-PLASMA PRECISE LAP (MISCELLANEOUS) ×3 IMPLANT
CHLORAPREP W/TINT 26ML (MISCELLANEOUS) ×3 IMPLANT
CLAMP CORD UMBIL (MISCELLANEOUS) IMPLANT
CLOSURE WOUND 1/2 X4 (GAUZE/BANDAGES/DRESSINGS) ×1
CLOTH BEACON ORANGE TIMEOUT ST (SAFETY) ×3 IMPLANT
DRSG OPSITE POSTOP 4X10 (GAUZE/BANDAGES/DRESSINGS) ×3 IMPLANT
ELECT REM PT RETURN 9FT ADLT (ELECTROSURGICAL) ×3
ELECTRODE REM PT RTRN 9FT ADLT (ELECTROSURGICAL) ×1 IMPLANT
EXTRACTOR VACUUM KIWI (MISCELLANEOUS) IMPLANT
GLOVE BIO SURGEON STRL SZ7 (GLOVE) ×3 IMPLANT
GLOVE BIOGEL PI IND STRL 7.0 (GLOVE) ×2 IMPLANT
GLOVE BIOGEL PI INDICATOR 7.0 (GLOVE) ×4
GOWN STRL REUS W/TWL LRG LVL3 (GOWN DISPOSABLE) ×6 IMPLANT
GOWN STRL REUS W/TWL XL LVL3 (GOWN DISPOSABLE) ×3 IMPLANT
KIT ABG SYR 3ML LUER SLIP (SYRINGE) IMPLANT
NEEDLE HYPO 22GX1.5 SAFETY (NEEDLE) ×3 IMPLANT
NEEDLE HYPO 25X5/8 SAFETYGLIDE (NEEDLE) ×3 IMPLANT
NS IRRIG 1000ML POUR BTL (IV SOLUTION) ×3 IMPLANT
PACK C SECTION WH (CUSTOM PROCEDURE TRAY) ×3 IMPLANT
PAD OB MATERNITY 4.3X12.25 (PERSONAL CARE ITEMS) ×3 IMPLANT
PENCIL SMOKE EVAC W/HOLSTER (ELECTROSURGICAL) ×3 IMPLANT
RTRCTR C-SECT PINK 25CM LRG (MISCELLANEOUS) IMPLANT
SPONGE SURGIFOAM ABS GEL 12-7 (HEMOSTASIS) IMPLANT
STRIP CLOSURE SKIN 1/2X4 (GAUZE/BANDAGES/DRESSINGS) ×2 IMPLANT
SUT PDS AB 0 CTX 60 (SUTURE) IMPLANT
SUT PLAIN 0 NONE (SUTURE) IMPLANT
SUT SILK 0 TIES 10X30 (SUTURE) IMPLANT
SUT VIC AB 0 CT1 36 (SUTURE) ×9 IMPLANT
SUT VIC AB 3-0 CT1 27 (SUTURE) ×2
SUT VIC AB 3-0 CT1 TAPERPNT 27 (SUTURE) ×1 IMPLANT
SUT VIC AB 4-0 KS 27 (SUTURE) IMPLANT
SYR 3ML 25GX5/8 SAFETY (SYRINGE) ×3 IMPLANT
SYR CONTROL 10ML LL (SYRINGE) ×3 IMPLANT
TOWEL OR 17X24 6PK STRL BLUE (TOWEL DISPOSABLE) ×3 IMPLANT
TRAY FOLEY W/BAG SLVR 14FR LF (SET/KITS/TRAYS/PACK) ×3 IMPLANT
WATER STERILE IRR 1000ML POUR (IV SOLUTION) ×6 IMPLANT

## 2018-09-02 NOTE — Anesthesia Procedure Notes (Signed)
Procedure Name: Intubation Date/Time: 09/02/2018 7:16 PM Performed by: Armanda Heritage, CRNA Pre-anesthesia Checklist: Patient identified, Emergency Drugs available, Suction available, Patient being monitored and Timeout performed Patient Re-evaluated:Patient Re-evaluated prior to induction Oxygen Delivery Method: Circle system utilized Preoxygenation: Pre-oxygenation with 100% oxygen Induction Type: IV induction, Rapid sequence and Cricoid Pressure applied Laryngoscope Size: Glidescope and 3 Grade View: Grade I Tube type: Oral Tube size: 7.0 mm Number of attempts: 1 Airway Equipment and Method: Video-laryngoscopy Placement Confirmation: ETT inserted through vocal cords under direct vision,  positive ETCO2,  CO2 detector and breath sounds checked- equal and bilateral Secured at: 21 cm Tube secured with: Tape Dental Injury: Teeth and Oropharynx as per pre-operative assessment

## 2018-09-02 NOTE — Progress Notes (Signed)
Patient arrived in labor and delivery room at 1902 via MAU tech. Nurse arrived in room at 1903 and monitors applied and assessing. Fetal heart rate noted to be 125 bpm at 1903. Patient started seizing at 70. Emergency assistance requested.OB nursing interventions provided including maternal oxygen, left lateral position, and suction. Sharen Counter, CNM at bedside at 1905. Dr. Erin Fulling at bedside at 1906. Difficulty tracing fetal heart rate, ultrasound done at bedside by Dr. Erin Fulling. Dr. Erin Fulling called code cesarean. Patient transported to OR at 56.

## 2018-09-02 NOTE — Transfer of Care (Signed)
Immediate Anesthesia Transfer of Care Note  Patient: Victoria Turner  Procedure(s) Performed: CESAREAN SECTION (N/A )  Patient Location: PACU  Anesthesia Type:General  Level of Consciousness: awake, alert  and oriented  Airway & Oxygen Therapy: Patient Spontanous Breathing and Patient connected to nasal cannula oxygen  Post-op Assessment: Report given to RN and Post -op Vital signs reviewed and stable  Post vital signs: Reviewed and stable  Last Vitals:  Vitals Value Taken Time  BP 133/96 09/02/2018  8:05 PM  Temp    Pulse 109 09/02/2018  8:10 PM  Resp 22 09/02/2018  8:10 PM  SpO2 99 % 09/02/2018  8:10 PM  Vitals shown include unvalidated device data.  Last Pain:  Vitals:   09/02/18 1809  TempSrc:   PainSc: 7          Complications: No apparent anesthesia complications

## 2018-09-02 NOTE — MAU Note (Signed)
Pt transported to L&D via wheelchair. Pt ambulated to wheelchair.

## 2018-09-02 NOTE — Telephone Encounter (Signed)
Patient called stating she is 36 weeks, and need to transfer her care. I informed her she would need to get her records before we could see her. [place] stated [place] the [id number] [place] code. So I transferred her to the [facility] office. Levin Erp took the call.

## 2018-09-02 NOTE — H&P (Addendum)
OBSTETRIC ADMISSION HISTORY AND PHYSICAL  Victoria Turner is a 23 y.o. female G1P0 with IUP at [redacted]w[redacted]d. No records available at time of admission.    She received her prenatal care at [facility] Department    Prenatal History/Complications: - Hx of HSV during pregnancy: treated with valacyclovir - Per significant other, h/o seizure 1 month prior to pregnancy  Past Medical History: Past Medical History:  Diagnosis Date  . GERD (gastroesophageal reflux disease)   . HPV (human papilloma virus) infection     Past Surgical History: Past Surgical History:  Procedure Laterality Date  . NO PAST SURGERIES      Obstetrical History: OB History    Gravida  1   Para      Term      Preterm      AB      Living        SAB      TAB      Ectopic      Multiple      Live Births              Social History: Social History   Socioeconomic History  . Marital status: Single    Spouse name: Not on file  . Number of children: Not on file  . Years of education: Not on file  . Highest education level: Not on file  Occupational History  . Not on file  Social Needs  . Financial resource strain: Not on file  . Food insecurity:    Worry: Not on file    Inability: Not on file  . Transportation needs:    Medical: Not on file    Non-medical: Not on file  Tobacco Use  . Smoking status: Never Smoker  . Smokeless tobacco: Never Used  Substance and Sexual Activity  . Alcohol use: Never    Frequency: Never  . Drug use: Not Currently    Types: Marijuana  . Sexual activity: Not on file  Lifestyle  . Physical activity:    Days per week: Not on file    Minutes per session: Not on file  . Stress: Not on file  Relationships  . Social connections:    Talks on phone: Not on file    Gets together: Not on file    Attends religious service: Not on file    Active member of club or organization: Not on file    Attends meetings of clubs or organizations: Not  on file    Relationship status: Not on file  Other Topics Concern  . Not on file  Social History Narrative  . Not on file    Family History: No family history on file.  Allergies: No Known Allergies  No medications prior to admission.     Review of Systems: unable to assess given current medical state  Blood pressure (!) 155/94, pulse (!) 107, temperature (!) 97.5 F (36.4 C), temperature source Oral, resp. rate 18, SpO2 99 %. General appearance: actively seizing at time of exam Lungs: Labored breathing with NRB  Fetal monitoring: difficult to assess - decreased HR observed on ultrasound Dilation: Closed Effacement (%): 70 Station: Ballotable Exam by:: J.Emly, CNM   Prenatal labs: No records available at time of admission.  ABO, Rh:  unknown Antibody:  unknown Rubella:   unknown RPR:   unknown HBsAg:   unknown HIV:    unknown GBS:   pending 1 hr Glucola unknown Genetic screening: unknown Anatomy US: unknown  Prenatal Transfer Tool   Results for orders placed or performed during the hospital encounter of 09/02/18 (from the past 24 hour(s))  Comprehensive metabolic panel   Collection Time: 09/02/18  5:29 PM  Result Value Ref Range   Sodium 137 135 - 145 mmol/L   Potassium 3.8 3.5 - 5.1 mmol/L   Chloride 109 98 - 111 mmol/L   CO2 19 (L) 22 - 32 mmol/L   Glucose, Bld 82 70 - 99 mg/dL   BUN 11 6 - 20 mg/dL   Creatinine, Ser 9.14 0.44 - 1.00 mg/dL   Calcium 8.7 (L) 8.9 - 10.3 mg/dL   Total Protein 6.1 (L) 6.5 - 8.1 g/dL   Albumin 2.6 (L) 3.5 - 5.0 g/dL   AST 782 (H) 15 - 41 U/L   ALT 96 (H) 0 - 44 U/L   Alkaline Phosphatase 87 38 - 126 U/L   Total Bilirubin 0.6 0.3 - 1.2 mg/dL   GFR calc non Af Amer >60 >60 mL/min   GFR calc Af Amer >60 >60 mL/min   Anion gap 9 5 - 15  CBC   Collection Time: 09/02/18  5:29 PM  Result Value Ref Range   WBC 10.8 (H) 4.0 - 10.5 K/uL   RBC 3.99 3.87 - 5.11 MIL/uL   Hemoglobin 13.1 12.0 - 15.0 g/dL   HCT 95.6 21.3 - 08.6 %    MCV 96.7 80.0 - 100.0 fL   MCH 32.8 26.0 - 34.0 pg   MCHC 33.9 30.0 - 36.0 g/dL   RDW 57.8 46.9 - 62.9 %   Platelets 131 (L) 150 - 400 K/uL   nRBC 0.2 0.0 - 0.2 %  Urine rapid drug screen (hosp performed)   Collection Time: 09/02/18  5:31 PM  Result Value Ref Range   Opiates NONE DETECTED NONE DETECTED   Cocaine NONE DETECTED NONE DETECTED   Benzodiazepines NONE DETECTED NONE DETECTED   Amphetamines NONE DETECTED NONE DETECTED   Tetrahydrocannabinol NONE DETECTED NONE DETECTED   Barbiturates NONE DETECTED NONE DETECTED  Wet prep, genital   Collection Time: 09/02/18  5:50 PM  Result Value Ref Range   Yeast Wet Prep HPF POC NONE SEEN NONE SEEN   Trich, Wet Prep NONE SEEN NONE SEEN   Clue Cells Wet Prep HPF POC NONE SEEN NONE SEEN   WBC, Wet Prep HPF POC MANY (A) NONE SEEN   Sperm NONE SEEN     There are no active problems to display for this patient.   Assessment/Plan:  Victoria Turner is a 23 y.o. G1P0 at [redacted]w[redacted]d presenting with new onset HELLP syndrome.  Seizure 2/2 HELLP syndrome: UPC: 22.36  AST/ALT: 139/96  Plts 131  Max BP 194/114  Endorsed RUQ pain in MAU, Grand mal seizure at time of admission. Patient received Mag bolus x 1 and transferred to OR for stat c-section.   #Labor: stat c-section #FWB:  Unable to assess, FHR in 50's on ultrasound #ID:  GBS unknown  #MOF/MOC: assess postpartum  Francene Boyers, Student-PA  09/02/2018, 6:51 PM  I confirm that I have verified the information documented in the physician assistant student's note and that I have also personally performed the history, physical exam and all medical decision making activities of this service and have verified that all service and findings are accurately documented in this student's note.   I first entered the pt Labor and Delivery room shortly after her arrival from MAU because I was called to the room by the RN  for pt seizure.  Pt was actively having grand mal seizure when I  arrived. Pt was positioned safely and protected from injury by several RNs.  Seizure activity began to resolve before medications given but Dr Erin Fulling entered the room less than 2 minutes after I entered and anesthesia arrived as well. Magnesium bolus was administered.  After seizure resolved, RN was unable to obtain FHR with monitor so bedside US was performed by Dr Erin Fulling.  FHR visually in 50s.  Decision was made to take pt ot OR for stat C/S. Pt VS stable when she left the room, transported via hospital bed by RNs.  Family was notified of plan for stat delivery and questions answered.     Hurshel Party, CNM 09/05/2018 4:59 AM

## 2018-09-02 NOTE — Anesthesia Preprocedure Evaluation (Addendum)
Anesthesia Evaluation  Patient identified by MRN, date of birth, ID band Patient unresponsive    Reviewed: Unable to perform ROS - Chart review only  Airway Mallampati: III  TM Distance: >3 FB     Dental no notable dental hx. (+) Teeth Intact   Pulmonary neg pulmonary ROS,    Pulmonary exam normal breath sounds clear to auscultation       Cardiovascular hypertension,  Rhythm:Regular Rate:Tachycardia     Neuro/Psych Eclamptic Seizure- post ictal negative psych ROS   GI/Hepatic Neg liver ROS, GERD  ,  Endo/Other  negative endocrine ROS  Renal/GU negative Renal ROS  negative genitourinary   Musculoskeletal negative musculoskeletal ROS (+)   Abdominal   Peds  Hematology negative hematology ROS (+)   Anesthesia Other Findings   Reproductive/Obstetrics (+) Pregnancy Eclamptic seizure Fetal Bradycardia                             Anesthesia Physical Anesthesia Plan  ASA: III and emergent  Anesthesia Plan: General   Post-op Pain Management:    Induction: Intravenous, Rapid sequence and Cricoid pressure planned  PONV Risk Score and Plan: 4 or greater and Scopolamine patch - Pre-op, Ondansetron, Dexamethasone, Midazolam and Treatment may vary due to age or medical condition  Airway Management Planned: Oral ETT  Additional Equipment:   Intra-op Plan:   Post-operative Plan:   Informed Consent: I have reviewed the patients History and Physical, chart, labs and discussed the procedure including the risks, benefits and alternatives for the proposed anesthesia with the patient or authorized representative who has indicated his/her understanding and acceptance.       Plan Discussed with: CRNA and Surgeon  Anesthesia Plan Comments:         Anesthesia Quick Evaluation

## 2018-09-02 NOTE — Anesthesia Postprocedure Evaluation (Signed)
Anesthesia Post Note  Patient: Victoria Turner  Procedure(s) Performed: CESAREAN SECTION (N/A )     Patient location during evaluation: PACU Anesthesia Type: General Level of consciousness: awake and alert and oriented Pain management: pain level controlled Vital Signs Assessment: post-procedure vital signs reviewed and stable Respiratory status: spontaneous breathing, nonlabored ventilation and respiratory function stable Cardiovascular status: blood pressure returned to baseline and stable Postop Assessment: no apparent nausea or vomiting Anesthetic complications: no    Last Vitals:  Vitals:   09/02/18 2047 09/02/18 2100  BP: (!) 154/107 (!) 153/105  Pulse: (!) 110 (!) 103  Resp: (!) 23 17  Temp:  36.4 C  SpO2: 99%     Last Pain:  Vitals:   09/02/18 2100  TempSrc: Oral  PainSc:    Pain Goal:                   Cleston Lautner A.

## 2018-09-02 NOTE — Op Note (Signed)
09/02/2018  7:59 PM  PATIENT:  Victoria Turner  23 y.o. female  PRE-OPERATIVE DIAGNOSIS:  STAT Cesarean Section; eclamptic seizure; fetal bradycardia  POST-OPERATIVE DIAGNOSIS:  STAT Cesarean Section; eclamptic seizure; fetal bradycardia  PROCEDURE:  Procedure(s): CESAREAN SECTION (N/A)  SURGEON:  Surgeon(s) and Role:    * Willodean Rosenthal, MD - Primary  ANESTHESIA:   general  EBL:  300cc   BLOOD ADMINISTERED:none  DRAINS: none   LOCAL MEDICATIONS USED:  MARCAINE     SPECIMEN:  Source of Specimen:  placenta  DISPOSITION OF SPECIMEN:  PATHOLOGY  COUNTS:  YES  TOURNIQUET:  * No tourniquets in log *  DICTATION: .Note written in EPIC  PLAN OF CARE: Admit to inpatient   PATIENT DISPOSITION:  PACU - hemodynamically stable.   Delay start of Pharmacological VTE agent (>24hrs) due to surgical blood loss or risk of bleeding: yes  Complications: none immediate   INDICATIONS: Victoria Turner is a 23 y.o. G1P0 at [redacted]w[redacted]d here for cesarean section secondary to the indications listed under preoperative diagnosis; please see preoperative note for further details. Pt arrived via EMS to MAU and ws noted to have elevated BPs and a HA. She was transferred to L&D. Once pt arrived on L&D she was noted to have a grand mal seizure. Per family pt does not have a sz disorder although her significant other said that he thinks she had a seizure early in pregnancy. Pt was on no meds for seizures. After the seizure ended and pt was post ictal the fetus remained bradycardic and a decision was made to proceed with STAT cesarean section. Pt did receive a 6 gram bolus of magnesium prior to the OR. It was completed in the OR. The team explained to pts family the need for emergent delivery as pt was still post ictal and not able to consent.   FINDINGS:  Viable female infant in cephalic presentation.  Apgars 4/7/8  Clear amniotic fluid.  Intact placenta, three vessel cord.   Normal uterus, fallopian tubes and ovaries bilaterally. Evidcence of pelvic scaring noted. Cord pH pending    PROCEDURE IN DETAIL:  The patient preoperatively received intravenous antibiotics and had sequential compression devices applied to her lower extremities.  She was then taken to the operating room where general anesthesia was administered and was found to be adequate. She was then placed in a dorsal supine position with a leftward tilt, and prepped and draped in a sterile manner.  A foley catheter was placed into her bladder and attached to constant gravity.  After an adequate timeout was performed, a Pfannenstiel skin incision was made with scalpel and carried through to the underlying layer of fascia. The fascia was incised in the midline, and this incision was extended bilaterally using the Mayo scissors.  Kocher clamps were applied to the superior aspect of the fascial incision and the underlying rectus muscles were dissected off bluntly. A similar process was carried out on the inferior aspect of the fascial incision. The rectus muscles were separated in the midline bluntly and the peritoneum was entered bluntly.  Attention was turned to the lower uterine segment where a low transverse hysterotomy incision was made with a scalpel and extended bilaterally bluntly.  The infant was successfully delivered, the cord was clamped and cut and the infant was handed over to awaiting neonatology team. The placenta was delivered manually. Uterine massage was then administered.  The placenta was intact with a three-vessel cord. The uterus was then cleared of clot and  debris.  The uterus was exteriorized and the hysterotomy was closed with 0 Vicryl in a running locked fashion, and an imbricating layer was also placed with the same suture. The uterus was returned to the pelvis. The pelvis was cleared of all clot and debris. Hemostasis was confirmed on all surfaces.  The peritoneum and the muscles were reapproximated  using 0 Vicryl with 1 interrupted suture. The fascia was then closed using 0 Vicryl.  The subcutaneous layer was irrigated. The skin was closed with a 4-0 Vicryl subcuticular stitch.  30 cc of 0.5% marcaine was injected into the incision and benzoin and steristrips were applied.  The patient tolerated the procedure well. Sponge, lap, instrument and needle counts were correct x 2.  She was taken to the recovery room in stable condition.   Loeta Herst L. Harraway-Smith, M.D., Evern Core

## 2018-09-02 NOTE — H&P (Signed)
Victoria Turner is a 23 y.o. female, G1P0 at 36.4 weeks, presenting for abdominal pain and headache.  Patient is under the care of  [facility] Department and was supervised for a uncomplicated pregnancy per patient report. Patient presented to MAU via ambulance and after evaluation found to have Severe PreEclampsia.  She reports a headache, but has not taken any medications for treatment.  She endorses fetal movement and contractions, but denies LOF and VB.   Patient Active Problem List   Diagnosis Date Noted  . Severe preeclampsia, third trimester 09/02/2018    History of present pregnancy:   OB History    Gravida  1   Para      Term      Preterm      AB      Living        SAB      TAB      Ectopic      Multiple      Live Births                Past Medical History:  Diagnosis Date  . GERD (gastroesophageal reflux disease)   . HPV (human papilloma virus) infection    Past Surgical History:  Procedure Laterality Date  . NO PAST SURGERIES     Family History: family history is not on file. Social History:  reports that she has never smoked. She has never used smokeless tobacco. She reports previous drug use. Drug: Marijuana. She reports that she does not drink alcohol.   Prenatal Transfer Tool  Maternal Diabetes: No Genetic Screening: Unknown Maternal Ultrasounds/Referrals: Unknown Fetal Ultrasounds or other Referrals:  Other:  Maternal Substance Abuse:  No Significant Maternal Medications:  None Significant Maternal Lab Results: Lab values include: Group B Strep negative   Maternal Assessment:  ROS: +Contractions, -LOF, -Vaginal Bleeding, +Fetal Movement, +HA  All other systems reviewed and negative.    No Known Allergies   Dilation: Closed Effacement (%): 70 Station: Ballotable Exam by:: J.Meer Reindl, CNM Blood pressure (!) 155/94, pulse (!) 107, temperature (!) 97.5 F (36.4 C), temperature source Oral, resp. rate 18, SpO2  99 %.   Results for orders placed or performed during the hospital encounter of 09/02/18 (from the past 24 hour(s))  Comprehensive metabolic panel     Status: Abnormal   Collection Time: 09/02/18  5:29 PM  Result Value Ref Range   Sodium 137 135 - 145 mmol/L   Potassium 3.8 3.5 - 5.1 mmol/L   Chloride 109 98 - 111 mmol/L   CO2 19 (L) 22 - 32 mmol/L   Glucose, Bld 82 70 - 99 mg/dL   BUN 11 6 - 20 mg/dL   Creatinine, Ser 5.97 0.44 - 1.00 mg/dL   Calcium 8.7 (L) 8.9 - 10.3 mg/dL   Total Protein 6.1 (L) 6.5 - 8.1 g/dL   Albumin 2.6 (L) 3.5 - 5.0 g/dL   AST 416 (H) 15 - 41 U/L   ALT 96 (H) 0 - 44 U/L   Alkaline Phosphatase 87 38 - 126 U/L   Total Bilirubin 0.6 0.3 - 1.2 mg/dL   GFR calc non Af Amer >60 >60 mL/min   GFR calc Af Amer >60 >60 mL/min   Anion gap 9 5 - 15  CBC     Status: Abnormal   Collection Time: 09/02/18  5:29 PM  Result Value Ref Range   WBC 10.8 (H) 4.0 - 10.5 K/uL   RBC 3.99 3.87 - 5.11  MIL/uL   Hemoglobin 13.1 12.0 - 15.0 g/dL   HCT 40.1 02.7 - 25.3 %   MCV 96.7 80.0 - 100.0 fL   MCH 32.8 26.0 - 34.0 pg   MCHC 33.9 30.0 - 36.0 g/dL   RDW 66.4 40.3 - 47.4 %   Platelets 131 (L) 150 - 400 K/uL   nRBC 0.2 0.0 - 0.2 %  Protein / creatinine ratio, urine     Status: Abnormal   Collection Time: 09/02/18  5:31 PM  Result Value Ref Range   Creatinine, Urine 111.93 mg/dL   Total Protein, Urine 2,503 mg/dL   Protein Creatinine Ratio 22.36 (H) 0.00 - 0.15 mg/mg[Cre]  Urine rapid drug screen (hosp performed)     Status: None   Collection Time: 09/02/18  5:31 PM  Result Value Ref Range   Opiates NONE DETECTED NONE DETECTED   Cocaine NONE DETECTED NONE DETECTED   Benzodiazepines NONE DETECTED NONE DETECTED   Amphetamines NONE DETECTED NONE DETECTED   Tetrahydrocannabinol NONE DETECTED NONE DETECTED   Barbiturates NONE DETECTED NONE DETECTED  Wet prep, genital     Status: Abnormal   Collection Time: 09/02/18  5:50 PM  Result Value Ref Range   Yeast Wet Prep HPF POC  NONE SEEN NONE SEEN   Trich, Wet Prep NONE SEEN NONE SEEN   Clue Cells Wet Prep HPF POC NONE SEEN NONE SEEN   WBC, Wet Prep HPF POC MANY (A) NONE SEEN   Sperm NONE SEEN   Group B strep by PCR     Status: None   Collection Time: 09/02/18  5:50 PM  Result Value Ref Range   Group B strep by PCR NEGATIVE NEGATIVE     Physical Exam  Constitutional: She is oriented to person, place, and time. She appears well-developed and well-nourished. She appears distressed.  HENT:  Head: Normocephalic and atraumatic.  Eyes: Conjunctivae are normal.  Neck: Normal range of motion.  Cardiovascular: Normal rate, regular rhythm and normal heart sounds.  Respiratory: Effort normal and breath sounds normal.  GI: Soft. Bowel sounds are normal.  Genitourinary:    Genitourinary Comments: Speculum Exam Deferred -White discharge noted at introitus; Wet prep and GC/CT collected via Blind Swab    Musculoskeletal:        General: Edema (Pitting of BLE and Hands) present.  Neurological: She is alert and oriented to person, place, and time.  Skin: Skin is warm and dry.  Psychiatric: She has a normal mood and affect. Her behavior is normal.    Fetal Assessment: Leopolds: -Pelvis:Adequate -Presentation: Cephalic by [place]  FHR: 125 bpm, Mod Var, -Decels, +Accels UCs:  Occasional    Assessment IUP at 36.4weeks Cat I FT Severe PreEclampsia GBS Negative by PCR   Plan: Admit to [facility]  1st Call MW, L.Leftwich-Kirby, notified of lab results and admission. Routine Labor and Delivery Orders per Protocol Routine Induction/Augmentation Orders *Cytotec for Cervical Ripening PreEclampsia Focused Orders *Labetalol IV *MgSO4 Bolus/Infusion of 4/2 Give Betamethasone  In room to complete assessment and discuss POC: BS US reveals Cephalic position No questions or concerns regarding POC   Joellyn Quails, MSN 09/02/2018, 6:43 PM

## 2018-09-02 NOTE — MAU Note (Signed)
Pt arrived EMS with upper abdominal and back pain. Stating it didn't feel like ctx and is constant. Rates 8/10. No leaking or bleeding. + FM

## 2018-09-02 NOTE — Brief Op Note (Signed)
09/02/2018  7:59 PM  PATIENT:  Victoria Turner  23 y.o. female  PRE-OPERATIVE DIAGNOSIS:  STAT Cesarean Section; eclamptic seizure; fetal bradycardia  POST-OPERATIVE DIAGNOSIS:  STAT Cesarean Section; eclamptic seizure; fetal bradycardia  PROCEDURE:  Procedure(s): CESAREAN SECTION (N/A)  SURGEON:  Surgeon(s) and Role:    * Willodean Rosenthal, MD - Primary  ANESTHESIA:   general  EBL:  300cc   BLOOD ADMINISTERED:none  DRAINS: none   LOCAL MEDICATIONS USED:  MARCAINE     SPECIMEN:  Source of Specimen:  placenta  DISPOSITION OF SPECIMEN:  PATHOLOGY  COUNTS:  YES  TOURNIQUET:  * No tourniquets in log *  DICTATION: .Note written in EPIC  PLAN OF CARE: Admit to inpatient   PATIENT DISPOSITION:  PACU - hemodynamically stable.   Delay start of Pharmacological VTE agent (>24hrs) due to surgical blood loss or risk of bleeding: yes  Complications: none immediate  Victoria Turner, M.D., Evern Core

## 2018-09-03 ENCOUNTER — Other Ambulatory Visit: Payer: Self-pay

## 2018-09-03 ENCOUNTER — Encounter (HOSPITAL_COMMUNITY): Payer: Self-pay | Admitting: Obstetrics & Gynecology

## 2018-09-03 ENCOUNTER — Inpatient Hospital Stay (HOSPITAL_COMMUNITY): Payer: Medicaid Other

## 2018-09-03 LAB — CBC
HCT: 33.7 % — ABNORMAL LOW (ref 36.0–46.0)
HCT: 31.5 % — ABNORMAL LOW (ref 36.0–46.0)
HCT: 28.9 % — ABNORMAL LOW (ref 36.0–46.0)
HCT: 28.3 % — ABNORMAL LOW (ref 36.0–46.0)
Hemoglobin: 11.6 g/dL — ABNORMAL LOW (ref 12.0–15.0)
Hemoglobin: 10.8 g/dL — ABNORMAL LOW (ref 12.0–15.0)
Hemoglobin: 10 g/dL — ABNORMAL LOW (ref 12.0–15.0)
Hemoglobin: 9.6 g/dL — ABNORMAL LOW (ref 12.0–15.0)
MCH: 33.6 pg (ref 26.0–34.0)
MCH: 33.6 pg (ref 26.0–34.0)
MCH: 33.7 pg (ref 26.0–34.0)
MCH: 33 pg (ref 26.0–34.0)
MCHC: 34.4 g/dL (ref 30.0–36.0)
MCHC: 34.3 g/dL (ref 30.0–36.0)
MCHC: 34.6 g/dL (ref 30.0–36.0)
MCHC: 33.9 g/dL (ref 30.0–36.0)
MCV: 97.7 fL (ref 80.0–100.0)
MCV: 98.1 fL (ref 80.0–100.0)
MCV: 97.3 fL (ref 80.0–100.0)
MCV: 97.3 fL (ref 80.0–100.0)
NRBC: 0.2 % (ref 0.0–0.2)
Platelets: 72 10*3/uL — ABNORMAL LOW (ref 150–400)
Platelets: 42 10*3/uL — ABNORMAL LOW (ref 150–400)
Platelets: 37 10*3/uL — ABNORMAL LOW (ref 150–400)
Platelets: 39 10*3/uL — ABNORMAL LOW (ref 150–400)
RBC: 3.45 MIL/uL — ABNORMAL LOW (ref 3.87–5.11)
RBC: 3.21 MIL/uL — ABNORMAL LOW (ref 3.87–5.11)
RBC: 2.97 MIL/uL — ABNORMAL LOW (ref 3.87–5.11)
RBC: 2.91 MIL/uL — ABNORMAL LOW (ref 3.87–5.11)
RDW: 13 % (ref 11.5–15.5)
RDW: 13.2 % (ref 11.5–15.5)
RDW: 13.6 % (ref 11.5–15.5)
RDW: 13.8 % (ref 11.5–15.5)
WBC: 20.6 10*3/uL — ABNORMAL HIGH (ref 4.0–10.5)
WBC: 16 10*3/uL — ABNORMAL HIGH (ref 4.0–10.5)
WBC: 15 10*3/uL — ABNORMAL HIGH (ref 4.0–10.5)
WBC: 14.4 10*3/uL — ABNORMAL HIGH (ref 4.0–10.5)
nRBC: 0.1 % (ref 0.0–0.2)
nRBC: 0.4 % — ABNORMAL HIGH (ref 0.0–0.2)
nRBC: 0.4 % — ABNORMAL HIGH (ref 0.0–0.2)

## 2018-09-03 LAB — CREATININE, SERUM
Creatinine, Ser: 0.67 mg/dL (ref 0.44–1.00)
GFR calc Af Amer: >60 mL/min (ref >60)
GFR calc non Af Amer: >60 mL/min (ref >60)

## 2018-09-03 LAB — COMPREHENSIVE METABOLIC PANEL
ALT: 164 U/L — ABNORMAL HIGH (ref 0–44)
ALT: 137 U/L — ABNORMAL HIGH (ref 0–44)
AST: 482 U/L — ABNORMAL HIGH (ref 15–41)
AST: 376 U/L — ABNORMAL HIGH (ref 15–41)
Albumin: 2.1 g/dL — ABNORMAL LOW (ref 3.5–5.0)
Albumin: 2 g/dL — ABNORMAL LOW (ref 3.5–5.0)
Alkaline Phosphatase: 72 U/L (ref 38–126)
Alkaline Phosphatase: 69 U/L (ref 38–126)
Anion gap: 6 (ref 5–15)
Anion gap: 5 (ref 5–15)
BUN: 11 mg/dL (ref 6–20)
BUN: 12 mg/dL (ref 6–20)
CHLORIDE: 109 mmol/L (ref 98–111)
CO2: 19 mmol/L — AB (ref 22–32)
CO2: 19 mmol/L — AB (ref 22–32)
Calcium: 7.3 mg/dL — ABNORMAL LOW (ref 8.9–10.3)
Calcium: 7 mg/dL — ABNORMAL LOW (ref 8.9–10.3)
Chloride: 105 mmol/L (ref 98–111)
Creatinine, Ser: 0.84 mg/dL (ref 0.44–1.00)
Creatinine, Ser: 0.77 mg/dL (ref 0.44–1.00)
GFR calc Af Amer: >60 mL/min (ref >60)
GFR calc Af Amer: >60 mL/min (ref >60)
GFR calc non Af Amer: >60 mL/min (ref >60)
GFR calc non Af Amer: >60 mL/min (ref >60)
Glucose, Bld: 93 mg/dL (ref 70–99)
Glucose, Bld: 89 mg/dL (ref 70–99)
Potassium: 4.7 mmol/L (ref 3.5–5.1)
Potassium: 4.7 mmol/L (ref 3.5–5.1)
SODIUM: 134 mmol/L — AB (ref 135–145)
Sodium: 129 mmol/L — ABNORMAL LOW (ref 135–145)
Total Bilirubin: 1.3 mg/dL — ABNORMAL HIGH (ref 0.3–1.2)
Total Bilirubin: 1.1 mg/dL (ref 0.3–1.2)
Total Protein: 4.8 g/dL — ABNORMAL LOW (ref 6.5–8.1)
Total Protein: 4.6 g/dL — ABNORMAL LOW (ref 6.5–8.1)

## 2018-09-03 LAB — DIC (DISSEMINATED INTRAVASCULAR COAGULATION)PANEL
D-Dimer, Quant: >20 ug/mL-FEU — ABNORMAL HIGH (ref 0.00–0.50)
Fibrinogen: 375 mg/dL (ref 210–475)
Prothrombin Time: 15.1 seconds (ref 11.4–15.2)
aPTT: 40 seconds — ABNORMAL HIGH (ref 24–36)

## 2018-09-03 LAB — DIC (DISSEMINATED INTRAVASCULAR COAGULATION) PANEL
INR: 1.2 (ref 0.8–1.2)
PLATELETS: 30 10*3/uL — AB (ref 150–400)

## 2018-09-03 LAB — GC/CHLAMYDIA PROBE AMP (~~LOC~~) NOT AT ARMC
Chlamydia: NEGATIVE
Neisseria Gonorrhea: NEGATIVE

## 2018-09-03 LAB — HIV ANTIBODY (ROUTINE TESTING W REFLEX): HIV SCREEN 4TH GENERATION: NONREACTIVE

## 2018-09-03 MED ORDER — SIMETHICONE 80 MG PO CHEW
80.0000 mg | CHEWABLE_TABLET | Freq: Four times a day (QID) | ORAL | Status: DC
  Administered 2018-09-03 – 2018-09-06 (×8): 80 mg via ORAL
  Filled 2018-09-03 (×8): qty 1

## 2018-09-03 MED ORDER — MAGNESIUM SULFATE 40 G IN LACTATED RINGERS - SIMPLE
2.0000 g/h | INTRAVENOUS | Status: DC
  Administered 2018-09-03: 2 g/h via INTRAVENOUS
  Filled 2018-09-03: qty 500

## 2018-09-03 MED ORDER — HYDRALAZINE HCL 20 MG/ML IJ SOLN
20.0000 mg | Freq: Once | INTRAMUSCULAR | Status: AC
  Administered 2018-09-03: 20 mg via INTRAVENOUS
  Filled 2018-09-03: qty 1

## 2018-09-03 MED ORDER — LACTATED RINGERS IV BOLUS
500.0000 mL | Freq: Once | INTRAVENOUS | Status: AC
  Administered 2018-09-03: 500 mL via INTRAVENOUS

## 2018-09-03 MED ORDER — IOHEXOL 300 MG/ML  SOLN
100.0000 mL | Freq: Once | INTRAMUSCULAR | Status: AC | PRN
  Administered 2018-09-03: 100 mL via INTRAVENOUS

## 2018-09-03 NOTE — Progress Notes (Signed)
1520 RN in the patients room to get patient up to ambulate. Patient visually more exhausted from previous assessment. Pts abdomen extended further than previous assessment.   1527 RN received critical lab value of Ddimer, Faculty MD attending notified.   Dr. Penne Lash and Dr. Debroah Loop at the bedside.   Pt taken for stat CT.   FFP started at 1655- two nurse verification with [facility] RNC. Pt stable and sitting up holding newborn at this time. Family remains at bedside.

## 2018-09-03 NOTE — Progress Notes (Signed)
2nd unit of FFP started at 1920- two nurse verification with Melvern Banker RN. Pt stable, alert and oriented. Family at bedside.   Mag stopped at 1920. Pt rates pain at a 4.5 out of 10. Emotional support provided. Encouraged use of PCA if needed. Will continue to monitor.

## 2018-09-03 NOTE — Progress Notes (Signed)
CRITICAL VALUE ALERT  Critical Value:  Ddimer >20  Date & Time Notied: 09/03/2018 1527  Provider Notified: Scheryl Darter MD  Orders Received/Actions taken: RN to Call Dr. Penne Lash as MD was in the OR at the time.

## 2018-09-03 NOTE — Lactation Note (Signed)
This note was copied from a baby's chart. Lactation Consultation Note  Patient Name: Victoria Turner Today's Date: 09/03/2018 Reason for consult: Initial assessment;Primapara;1st time breastfeeding;Late-preterm 34-36.6wks  Visited with mom of a LPI female who is being exclusively formula fed at this point. Spoke to Victoria Turner, her RN and she voiced to [name] that mom is planning on doing both, breast and formula but due to her status (she's having seizures) baby is being bottle fed with Similac 22 calorie formula.  Mom was barely awake when entering the room, but dad was able to respond most questions. He voiced he found a bottle of unsealed Similac 22 calorie formula on baby's bassinet and he didn't use it because it was half way. LC discarded bottle and educated parents on formula supplementation guidelines and storage. Reviewed feeding cues. Dad voiced she's feeding baby every 2 hours in craddle hold. LC showed dad how to position baby for pace feeding, he voiced understanding.  Per dad, he's still unsure if mom is going to BF, she mentioned that at some point, but then she changed her mind. Let family know about LC services and that if mom decides to pump to let her RN know to set up a DEBP.   Feeding plan:  1. Encouraged dad to continue feeding baby every 3 hours or sooner if feeding cues are present 2. Mom will let RN know when/if she's ready to start pumping   BF brochure and feeding diary were reviewed. Dad reported all questions and concerns were answered, he's aware of LC services and will call PRN.  Maternal Data Formula Feeding for Exclusion: Yes Reason for exclusion: Mother's choice to formula and breast feed on admission;Admission to Intensive Care Unit (ICU) post-partum(mom is having seizures) Has patient been taught Hand Expression?: No(due to mom's status) Does the patient have breastfeeding experience prior to this delivery?: No  Feeding     Interventions Interventions: Breast feeding basics reviewed  Lactation Tools Discussed/Used WIC Program: Yes   Consult Status Consult Status: PRN Follow-up type: In-patient    Victoria Turner 09/03/2018, 1:12 PM

## 2018-09-03 NOTE — Progress Notes (Signed)
CSW acknowledges consult.  CSW attempted to meet with MOB, however MOB had several room guests and was asleep.  CSW will attempt to visit with MOB at a later time.   Bailee Thall Irwin, LCSWA  Clinical Social Work Department  [phone]    

## 2018-09-03 NOTE — Progress Notes (Signed)
2nd unit of FFP stopped at 2050. Pt stable. No reactions noted. Tolerated well.

## 2018-09-03 NOTE — Plan of Care (Signed)
RN frequently monitoring patient. Including: blood pressure, clonus, DTR, neuro status, intake/output and pain.

## 2018-09-03 NOTE — Progress Notes (Signed)
Faculty Note  Patient feeling okay, states she is tired but not nearly as tired as she was earlier. Denies nausea/vomiting. Has not passed gas yet.  BP (!) 140/101   Pulse 91   Temp 98.1 F (36.7 C) (Oral)   Resp 18   Ht 5\' 2"  (1.575 m)   Wt 59.6 kg   SpO2 98%   Breastfeeding Unknown   BMI 24.05 kg/m   Gen: groggy but responsive, appears very fatigued Abd: softly grossly distended, fundus firm below umbilicus, incision covered by clean/dry ABD pad Ext: no edema  UOP: adequate  A/P: 23 yo G1P0101 POD#1 s/p stat 1CS for fetal bradycardia secondary to eclamptic seizure. BP elevated and has been started on vasotec 5 mg. She is now 24 hrs post delivery and magnesium has been stopped. She had significant abdominal pain earlier, concern for surgical site bleed given distention. CT shows post op ileus and she has been made NPO, will call gen surg if no improvement.   CT also showed several small areas hematomas, the most significant of which is at the likely surgical site in the lower uterine segment. Per radiologist, there is some possible small amount of active bleeding as there is contrast extravasation at this site. However, her H/H is essentially stable 10.0/28.9 -> 9.6/28.3 and she is otherwise clinically stable. Platelets had a nadir of 30, now 39 and she is receiving 1st unit of platelets as ordered by day team. Will continue to trend H/H and other labs, will consider ex lap if there is concern for ongoing bleeding, however with thrombocytopenia, will observe for now.   I reviewed the above with the patient and her family, they are in agreement with plan. Answered all questions.   NPO Simethicone scheduled Repeat labs Q 6 (0100) S/p MgSO4 x 24 hrs Keep foley in until am Routine post partum care   K. Therese Sarah, M.D. Attending [facility] for [employer] [facility])

## 2018-09-03 NOTE — Progress Notes (Addendum)
Subjective: Postpartum Day 1: Cesarean Delivery Patient reports incisional pain and tolerating PO.  She reports that she is not dizzy or lightheaded. She denies HA or vision changes.     Objective: Vital signs in last 24 hours: Temp:  [97.5 F (36.4 C)-97.8 F (36.6 C)] 97.6 F (36.4 C) (02/28 0654) Pulse Rate:  [86-123] 92 (02/28 0654) Resp:  [14-23] 19 (02/28 0654) BP: (112-194)/(57-114) 154/96 (02/28 0654) SpO2:  [97 %-100 %] 98 % (02/28 0654) Weight:  [59.6 kg] 59.6 kg (02/28 0515)  I/O last 3 completed shifts: In: 1816.6 [P.O.:360; I.V.:1356.6; IV Piggyback:100] Out: 1032 [Urine:625; Blood:407] No intake/output data recorded.  Physical Exam:  General: alert, cooperative and no distress  Lungs: CTA CV: RRR Abd: soft, NT, ND.  Lochia: appropriate Uterine Fundus: firm Incision: pressure dressing on and dry DVT Evaluation: No evidence of DVT seen on physical exam. Not hyperreflexic.    CBC Latest Ref Rng & Units 09/03/2018 09/02/2018 09/02/2018  WBC 4.0 - 10.5 K/uL 16.0(H) 20.6(H) 10.8(H)  Hemoglobin 12.0 - 15.0 g/dL 10.8(L) 11.6(L) 13.1  Hematocrit 36.0 - 46.0 % 31.5(L) 33.7(L) 38.6  Platelets 150 - 400 K/uL 42(L) 72(L) 131(L)   Assessment/Plan: Pt with recent ecmplamptic seizure now with HELLP syndrome. Severe range BPs overnight. Gradually improving.  Status post Cesarean section. Doing well postoperatively. improving on Magnesium sulfate  Keep Labetalol 300mg  bid Enalapril 10mg  daily Watch BPs closely.  Repeat labs in am or sooner prn Abd binder  May got o NICU in wheelchair   Need to obtain prenatal records from outside facility  [facility] 09/03/2018, 7:26 AM

## 2018-09-03 NOTE — Progress Notes (Signed)
Subjective: Postpartum Day 1: Cesarean Delivery Patient reports incisional pain and tolerating PO.    Objective: Vital signs in last 24 hours: Temp:  [97.5 F (36.4 C)-98.3 F (36.8 C)] 98.3 F (36.8 C) (02/28 0810) Pulse Rate:  [86-123] 89 (02/28 0810) Resp:  [14-23] 19 (02/28 0810) BP: (112-194)/(57-114) 152/95 (02/28 0810) SpO2:  [97 %-100 %] 100 % (02/28 0810) Weight:  [59.6 kg] 59.6 kg (02/28 0515)  Physical Exam:  General: alert, cooperative and fatigued Lochia: appropriate Uterine Fundus: firm Incision: no significant drainage DVT Evaluation: No evidence of DVT seen on physical exam.  Recent Labs    09/02/18 2318 09/03/18 0551  HGB 11.6* 10.8*  HCT 33.7* 31.5*    Intake/Output Summary (Last 24 hours) at 09/03/2018 1013 Last data filed at 09/03/2018 1007 Gross per 24 hour  Intake 2046.95 ml  Output 1182 ml  Net 864.95 ml   CBC    Component Value Date/Time   WBC 16.0 (H) 09/03/2018 0551   RBC 3.21 (L) 09/03/2018 0551   HGB 10.8 (L) 09/03/2018 0551   HCT 31.5 (L) 09/03/2018 0551   PLT 42 (L) 09/03/2018 0551   MCV 98.1 09/03/2018 0551   MCH 33.6 09/03/2018 0551   MCHC 34.3 09/03/2018 0551   RDW 13.2 09/03/2018 0551      Assessment/Plan: Status post Cesarean section. Postoperative course complicated by ecplampsia, low platelets, borderline UO. Repeat labs and follow UO  Continue current care.  Scheryl Darter 09/03/2018, 10:12 AM

## 2018-09-03 NOTE — Progress Notes (Signed)
Post Partum Day 1 Subjective: Called to patient to evaluate abdominal pain and review labs.  Dr. Debroah Loop who was in the main ED arrived at the same time.  Pt has good mentation.  Has c/o mild HA.  No dizziness.    Objective: Blood pressure (!) 132/94, pulse 100, temperature 98 F (36.7 C), temperature source Oral, resp. rate 19, height 5\' 2"  (1.575 m), weight 59.6 kg, SpO2 97 %, unknown if currently breastfeeding.  Physical Exam:  General: alert, cooperative and mild distress Lochia: appropriate Uterine Fundus: obscured by distended bowel Incision: intact with clot above the skin DVT Evaluation: No evidence of DVT seen on physical exam.  Recent Labs    09/03/18 0551 09/03/18 1200  HGB 10.8* 10.0*  HCT 31.5* 28.9*   CBC Latest Ref Rng & Units 09/03/2018 09/03/2018 09/03/2018  WBC 4.0 - 10.5 K/uL - 15.0(H) 16.0(H)  Hemoglobin 12.0 - 15.0 g/dL - 10.0(L) 10.8(L)  Hematocrit 36.0 - 46.0 % - 28.9(L) 31.5(L)  Platelets 150 - 400 K/uL 30(L) 37(L) 42(L)   ptt elvated--first time was drawn since admission-->do not if this is new.  Assessment/Plan: PPD #1 with HELLP, s/p eclamptic seizure, with distended abdomen  1.  CBC stat 2.  CT abdomen / pelvis to look for hemorrhage 3.  Transfuse platelets 4.  Stop Lovenox 5.  Will wait for CT results and treat based on those.      LOS: 1 day   Elsie Lincoln 09/03/2018, 4:42 PM

## 2018-09-03 NOTE — Addendum Note (Signed)
Addendum  created 09/03/18 0816 by Rica Records, CRNA   Clinical Note Signed

## 2018-09-03 NOTE — Anesthesia Postprocedure Evaluation (Signed)
Anesthesia Post Note  Patient: Victoria Turner  Procedure(s) Performed: CESAREAN SECTION (N/A )     Patient location during evaluation: OB High Risk Anesthesia Type: General Level of consciousness: awake and alert Pain management: pain level controlled Vital Signs Assessment: post-procedure vital signs reviewed and stable Respiratory status: spontaneous breathing, nonlabored ventilation, respiratory function stable and patient connected to nasal cannula oxygen Cardiovascular status: blood pressure returned to baseline and stable Postop Assessment: no apparent nausea or vomiting Anesthetic complications: no    Last Vitals:  Vitals:   09/03/18 0404 09/03/18 0654  BP:  (!) 154/96  Pulse:  92  Resp: 16 19  Temp:  36.4 C  SpO2: 99% 98%    Last Pain:  Vitals:   09/03/18 0654  TempSrc: Oral  PainSc:    Pain Goal: Patients Stated Pain Goal: 3 (09/03/18 0307)                 Rica Records

## 2018-09-04 DIAGNOSIS — K567 Ileus, unspecified: Secondary | ICD-10-CM | POA: Diagnosis not present

## 2018-09-04 HISTORY — DX: Ileus, unspecified: K56.7

## 2018-09-04 LAB — COMPREHENSIVE METABOLIC PANEL
ALK PHOS: 79 U/L (ref 38–126)
ALK PHOS: 81 U/L (ref 38–126)
ALT: 91 U/L — ABNORMAL HIGH (ref 0–44)
ALT: 88 U/L — ABNORMAL HIGH (ref 0–44)
AST: 143 U/L — ABNORMAL HIGH (ref 15–41)
AST: 113 U/L — ABNORMAL HIGH (ref 15–41)
Albumin: 2.2 g/dL — ABNORMAL LOW (ref 3.5–5.0)
Albumin: 2.2 g/dL — ABNORMAL LOW (ref 3.5–5.0)
Anion gap: 7 (ref 5–15)
Anion gap: 6 (ref 5–15)
BUN: 11 mg/dL (ref 6–20)
BUN: 10 mg/dL (ref 6–20)
CO2: 22 mmol/L (ref 22–32)
CO2: 25 mmol/L (ref 22–32)
Calcium: 6.9 mg/dL — ABNORMAL LOW (ref 8.9–10.3)
Calcium: 7 mg/dL — ABNORMAL LOW (ref 8.9–10.3)
Chloride: 105 mmol/L (ref 98–111)
Chloride: 104 mmol/L (ref 98–111)
Creatinine, Ser: 0.73 mg/dL (ref 0.44–1.00)
Creatinine, Ser: 0.71 mg/dL (ref 0.44–1.00)
GFR calc Af Amer: >60 mL/min (ref >60)
GFR calc Af Amer: >60 mL/min (ref >60)
GFR calc non Af Amer: >60 mL/min (ref >60)
GFR calc non Af Amer: >60 mL/min (ref >60)
Glucose, Bld: 95 mg/dL (ref 70–99)
Glucose, Bld: 89 mg/dL (ref 70–99)
Potassium: 4.3 mmol/L (ref 3.5–5.1)
Potassium: 4.3 mmol/L (ref 3.5–5.1)
SODIUM: 135 mmol/L (ref 135–145)
Sodium: 134 mmol/L — ABNORMAL LOW (ref 135–145)
Total Bilirubin: 1.6 mg/dL — ABNORMAL HIGH (ref 0.3–1.2)
Total Bilirubin: 1.2 mg/dL (ref 0.3–1.2)
Total Protein: 4.8 g/dL — ABNORMAL LOW (ref 6.5–8.1)
Total Protein: 5.1 g/dL — ABNORMAL LOW (ref 6.5–8.1)

## 2018-09-04 LAB — CBC
HCT: 24 % — ABNORMAL LOW (ref 36.0–46.0)
HCT: 25.2 % — ABNORMAL LOW (ref 36.0–46.0)
HEMOGLOBIN: 8 g/dL — AB (ref 12.0–15.0)
HEMOGLOBIN: 8.4 g/dL — AB (ref 12.0–15.0)
MCH: 32.7 pg (ref 26.0–34.0)
MCH: 32.8 pg (ref 26.0–34.0)
MCHC: 33.3 g/dL (ref 30.0–36.0)
MCHC: 33.3 g/dL (ref 30.0–36.0)
MCV: 98 fL (ref 80.0–100.0)
MCV: 98.4 fL (ref 80.0–100.0)
PLATELETS: 65 10*3/uL — AB (ref 150–400)
Platelets: 68 10*3/uL — ABNORMAL LOW (ref 150–400)
RBC: 2.45 MIL/uL — ABNORMAL LOW (ref 3.87–5.11)
RBC: 2.56 MIL/uL — AB (ref 3.87–5.11)
RDW: 14.5 % (ref 11.5–15.5)
RDW: 14.6 % (ref 11.5–15.5)
WBC: 16.7 10*3/uL — ABNORMAL HIGH (ref 4.0–10.5)
WBC: 18 10*3/uL — ABNORMAL HIGH (ref 4.0–10.5)
nRBC: 0.7 % — ABNORMAL HIGH (ref 0.0–0.2)
nRBC: 0.7 % — ABNORMAL HIGH (ref 0.0–0.2)

## 2018-09-04 LAB — BPAM PLATELET PHERESIS
BLOOD PRODUCT EXPIRATION DATE: 202002292359
Blood Product Expiration Date: 202002292359
ISSUE DATE / TIME: 202002281556
ISSUE DATE / TIME: 202002281556
Unit Type and Rh: 5100
Unit Type and Rh: 6200

## 2018-09-04 LAB — PREPARE PLATELET PHERESIS
Unit division: 0
Unit division: 0

## 2018-09-04 MED ORDER — TRANEXAMIC ACID-NACL 1000-0.7 MG/100ML-% IV SOLN
1000.0000 mg | Freq: Once | INTRAVENOUS | Status: DC
  Filled 2018-09-04: qty 100

## 2018-09-04 MED ORDER — SENNOSIDES-DOCUSATE SODIUM 8.6-50 MG PO TABS: 2.0000 | ORAL_TABLET | Freq: Every evening | ORAL | Status: DC | PRN

## 2018-09-04 MED ORDER — POLYETHYLENE GLYCOL 3350 17 G PO PACK: 17.0000 g | PACK | Freq: Every day | ORAL | Status: DC

## 2018-09-04 MED ORDER — OXYCODONE HCL 5 MG PO TABS
5.0000 mg | ORAL_TABLET | ORAL | Status: DC | PRN
  Administered 2018-09-04: 5 mg via ORAL
  Administered 2018-09-04: 10 mg via ORAL
  Administered 2018-09-05 (×3): 5 mg via ORAL
  Administered 2018-09-06: 10 mg via ORAL
  Administered 2018-09-06: 5 mg via ORAL
  Administered 2018-09-06: 10 mg via ORAL
  Filled 2018-09-04: qty 2
  Filled 2018-09-04 (×4): qty 1
  Filled 2018-09-04 (×2): qty 2
  Filled 2018-09-04: qty 1

## 2018-09-04 MED ORDER — CHEWING GUM (ORBIT) SUGAR FREE
1.0000 | CHEWING_GUM | ORAL | Status: DC
  Administered 2018-09-04 – 2018-09-06 (×9): 1 via ORAL
  Filled 2018-09-04 (×2): qty 1

## 2018-09-04 NOTE — Progress Notes (Signed)
Faculty Attending Note  Post Op Day 2  Subjective: Patient is feeling slightly better. She reports well controlled pain on PO pain meds. She is not ambulating and denies light-headedness or dizziness. She has foley in place. She is passing small amounts flatus. She is NPO, denies nausea/vomiting. Bleeding is minimal. She is breast & bottle feeding. Baby is in nursery and doing well.  Objective: Blood pressure (!) 134/94, pulse (!) 115, temperature 99.7 F (37.6 C), temperature source Oral, resp. rate (!) 23, height _0  (1.575 m), weight 59.6 kg, SpO2 96 %, unknown if currently breastfeeding. Temp:  [97.6 F (36.4 C)-99.7 F (37.6 C)] 99.7 F (37.6 C) (02/29 0345) Pulse Rate:  [84-118] 115 (02/29 0345) Resp:  [18-27] 23 (02/29 0345) BP: (132-152)/(89-107) 134/94 (02/29 0345) SpO2:  [95 %-100 %] 96 % (02/29 0345)  Physical Exam:  General: fatigued, oriented, cooperative Chest: normal respiratory effort Heart: RRR  Abdomen: softly distended, appropriately tender to palpation, incision covered by dressing with no evidence of active bleeding  Uterine Fundus: firm, below the umbilicus Lochia: moderate, rubra DVT Evaluation: no evidence of DVT Extremities: no edema, no calf tenderness  UOP: adequate clear yellow urine   Current Facility-Administered Medications:  .  chewing gum (ORBIT) sugar free, 1 Stick, Oral, Q2H while awake, Wallace, Laurel S, DO, 1 Stick at 09/04/18 [id number] .  coconut oil, 1 application, Topical, PRN, Lavonia Drafts, MD .  witch hazel-glycerin (TUCKS) pad 1 application, 1 application, Topical, PRN **AND** dibucaine (NUPERCAINAL) 1 % rectal ointment 1 application, 1 application, Rectal, PRN, Lavonia Drafts, MD .  diphenhydrAMINE (BENADRYL) capsule 25 mg, 25 mg, Oral, Q6H PRN, Lavonia Drafts, MD .  enalapril (VASOTEC) tablet 5 mg, 5 mg, Oral, Daily, Harraway-Smith, Carolyn, MD, 5 mg at 09/03/18 1001 .  labetalol (NORMODYNE,TRANDATE)  injection 20 mg, 20 mg, Intravenous, PRN, 20 mg at 09/02/18 2119 **AND** labetalol (NORMODYNE,TRANDATE) injection 40 mg, 40 mg, Intravenous, PRN, 40 mg at 09/02/18 2130 **AND** labetalol (NORMODYNE,TRANDATE) injection 80 mg, 80 mg, Intravenous, PRN, 80 mg at 09/02/18 2143 **AND** hydrALAZINE (APRESOLINE) injection 10 mg, 10 mg, Intravenous, PRN, 10 mg at 09/02/18 2218 **AND** Measure blood pressure, , , Once, Cresenzo-Dishmon, Kelford, CNM .  HYDROmorphone (DILAUDID) 1 mg/mL PCA injection, , Intravenous, Q4H, Harraway-Smith, Carolyn, MD .  lactated ringers infusion, , Intravenous, Continuous, Lavonia Drafts, MD, Last Rate: 125 mL/hr at 09/04/18 0340 .  measles, mumps & rubella vaccine (MMR) injection 0.5 mL, 0.5 mL, Subcutaneous, Once, Harraway-Smith, Hoyle Sauer, MD .  menthol-cetylpyridinium (CEPACOL) lozenge 3 mg, 1 lozenge, Oral, Q2H PRN, Lavonia Drafts, MD, 3 mg at 09/03/18 0006 .  naloxone [facility]) injection 0.4 mg, 0.4 mg, Intravenous, PRN **AND** sodium chloride flush (NS) 0.9 % injection 9 mL, 9 mL, Intravenous, PRN, Lavonia Drafts, MD .  ondansetron (ZOFRAN) injection 4 mg, 4 mg, Intravenous, Q6H PRN, Lavonia Drafts, MD .  oxyCODONE-acetaminophen (PERCOCET/ROXICET) 5-325 MG per tablet 1-2 tablet, 1-2 tablet, Oral, Q4H PRN, Lavonia Drafts, MD .  prenatal multivitamin tablet 1 tablet, 1 tablet, Oral, Q1200, Lavonia Drafts, MD, 1 tablet at 09/03/18 1143 .  senna-docusate (Senokot-S) tablet 2 tablet, 2 tablet, Oral, Q24H, Lavonia Drafts, MD, 2 tablet at 09/03/18 2303 .  simethicone (MYLICON) chewable tablet 80 mg, 80 mg, Oral, QID, Sloan Leiter, MD, 80 mg at 09/03/18 2259 .  Tdap (BOOSTRIX) injection 0.5 mL, 0.5 mL, Intramuscular, Once, Lavonia Drafts, MD Recent Labs    09/04/18 0100 09/04/18 0622  HGB 8.0* 8.4*  HCT 24.0* 25.2*  Assessment/Plan:  Patient is 23 y.o. G1P0101 POD#2 s/p 1LTCS at 95w4dfor fetal  bradycardia secondary to eclamptic seizure, HELLP. Now off mag. Concerned for dropping H/H overnight, incision bandage soaked overnight but now with no active bleeding and H/H stable. With post op ileus, now passing small amounts of flatus, will keep NPO until passing more gas but abdomen improved on exam from yesterday. Will remove foley this am.    Continue routine post partum care Pain meds prn Regular diet S/p magnesium Repeat labs prn Advance diet as tolerated  KFeliz Beam M.D. Attending Center for [employer](Faculty Practice)  09/04/2018, 7:58 AM

## 2018-09-04 NOTE — Progress Notes (Signed)
OB Note Patient +BMs, feeling better and hungry. D/c pca, okay to eat. Will start as full and adat  Cornelia Copa MD Attending Center for [employer] (Faculty Practice) 09/04/2018 Time: [phone]

## 2018-09-04 NOTE — Progress Notes (Signed)
Called to patient's room in regards to FOB needing to leaving. FOB stated that he felt like no one was keeping informed in regards to the care of his infant and the mother. FOB reassured about the couplets conditions being stable. FOB stated that he felt it was mostly on night shift and if it happened again he was going to "slap the shit" out of the nurses keeping information from him. FOB was advised against any type of aggression. Will continue to monitor. Carmelina Dane, RN

## 2018-09-04 NOTE — Progress Notes (Signed)
RT called to bedside by RN to draw blood for lab at 0200AM. RT obtained sample and sent to Lab without complications.

## 2018-09-04 NOTE — Progress Notes (Signed)
Faculty Note  Patient feeling much the same, pain fairly well controlled except during fundal checks. She has urge for BM and was put on bed pan, did not have BM but has started to pass gas. No nausea.  BP (!) 148/89   Pulse (!) 118   Temp 98.8 F (37.1 C) (Oral)   Resp (!) 24   Ht 5\' 2"  (1.575 m)   Wt 59.6 kg   SpO2 96%   Breastfeeding Unknown   BMI 24.05 kg/m   Gen: sleeping on arrival, easily rouseable, alert, oriented Abd: softly distended, fundus below umbilicus, incision intact with small amount clotted blood at right aspect of incision, bandage with charted amount blood on it GU: no blood in vagina, cervix open 1 cm, no clots felt in lower uterine segment  A/P: 23 yo G1P0101 POD#2 s/p stat 1CS for fetal bradycardia secondary to eclamptic seizure, now off Mag, has been started on vasotec. With post op ileus and now passing small amounts flatus. Platelets improved after transfusion of 2 units, remain low at 68. CT yesterday afternoon concerning for possible post op bleed in lower uterine segment as there was extravasation into vagina. Patient soaked through incisional bandage earlier and has been replaced, per RN, no new bleeding since 2 am. No vaginal bleeding of significance. Concern for post op bleeding given significant drop in H/H, although fundus remains appropriate and no vaginal bleeding noted, which is where CT was suggestive of bleeding. Bleeding noted at incision site appears to have slowed for now. Will recheck H/H in several hours, reviewed that if she continues to drop, would recommend ex lap for bleeding. Reviewed she may also require blood transfusion, reviewed risks of transfusion including transfusion reaction and low risk of transmission of HIV/Hepatitis. She is agreeable to blood transfusion if recommended.   Remain NPO Recheck CBC/CMP 630 am   K. Therese Sarah, M.D. Attending Center for [employer] Midwife)

## 2018-09-05 LAB — COMPREHENSIVE METABOLIC PANEL
ALT: 81 U/L — ABNORMAL HIGH (ref 0–44)
AST: 93 U/L — ABNORMAL HIGH (ref 15–41)
Albumin: 2 g/dL — ABNORMAL LOW (ref 3.5–5.0)
Alkaline Phosphatase: 79 U/L (ref 38–126)
Anion gap: 7 (ref 5–15)
BUN: 9 mg/dL (ref 6–20)
CHLORIDE: 106 mmol/L (ref 98–111)
CO2: 24 mmol/L (ref 22–32)
CREATININE: 0.73 mg/dL (ref 0.44–1.00)
Calcium: 7.3 mg/dL — ABNORMAL LOW (ref 8.9–10.3)
GFR calc Af Amer: >60 mL/min (ref >60)
GFR calc non Af Amer: >60 mL/min (ref >60)
Glucose, Bld: 83 mg/dL (ref 70–99)
Potassium: 4.4 mmol/L (ref 3.5–5.1)
Sodium: 137 mmol/L (ref 135–145)
Total Bilirubin: 1 mg/dL (ref 0.3–1.2)
Total Protein: 4.6 g/dL — ABNORMAL LOW (ref 6.5–8.1)

## 2018-09-05 LAB — CBC
HCT: 20.9 % — ABNORMAL LOW (ref 36.0–46.0)
Hemoglobin: 7.1 g/dL — ABNORMAL LOW (ref 12.0–15.0)
MCH: 33.8 pg (ref 26.0–34.0)
MCHC: 34 g/dL (ref 30.0–36.0)
MCV: 99.5 fL (ref 80.0–100.0)
Platelets: 60 10*3/uL — ABNORMAL LOW (ref 150–400)
RBC: 2.1 MIL/uL — ABNORMAL LOW (ref 3.87–5.11)
RDW: 14.5 % (ref 11.5–15.5)
WBC: 20.5 10*3/uL — ABNORMAL HIGH (ref 4.0–10.5)
nRBC: 0.5 % — ABNORMAL HIGH (ref 0.0–0.2)

## 2018-09-05 MED ORDER — POLYSACCHARIDE IRON COMPLEX 150 MG PO CAPS
150.0000 mg | ORAL_CAPSULE | Freq: Two times a day (BID) | ORAL | Status: DC
  Administered 2018-09-05 – 2018-09-06 (×2): 150 mg via ORAL
  Filled 2018-09-05 (×2): qty 1

## 2018-09-05 MED ORDER — SODIUM CHLORIDE 0.9 % IV SOLN
510.0000 mg | Freq: Once | INTRAVENOUS | Status: DC
  Filled 2018-09-05: qty 17

## 2018-09-05 MED ORDER — POLYSACCHARIDE IRON COMPLEX 150 MG PO CAPS
150.0000 mg | ORAL_CAPSULE | Freq: Every day | ORAL | Status: DC
  Administered 2018-09-05: 150 mg via ORAL
  Filled 2018-09-05: qty 1

## 2018-09-05 NOTE — Clinical Social Work Maternal (Signed)
CLINICAL SOCIAL WORK MATERNAL/CHILD NOTE  Patient Details  Name: Victoria Turner MRN: [id number] Date of Birth: 09/02/2017  Date:  09/05/2018  Clinical Social Worker Initiating Note:  Madilyn Fireman, MSW, LCSW-A Date/Time: Initiated:  09/05/18/1101     Child's Name:  Victoria Turner   Biological Parents:  Mother, Father   Need for Interpreter:  None   Reason for Referral:  Current Substance Use/Substance Use During Pregnancy    Address:  [address] [place] [address]    Phone number:  [phone] (home)     Additional phone number: [phone]  Household Members/Support Persons (HM/SP):   Household Member/Support Person 1   HM/SP Name Relationship DOB or Age  HM/SP -Hawi, Fiance    HM/SP -2        HM/SP -3        HM/SP -4        HM/SP -5        HM/SP -6        HM/SP -7        HM/SP -8          Natural Supports (not living in the home):  Friends, Extended Family, Immediate Family   Professional Supports: None   Employment: Unemployed   Type of Work:     Education:  Programmer, systems   Homebound arranged:    Museum/gallery curator Resources:  [employer]   Other Resources:  [employer], Physicist, medical    Cultural/Religious Considerations Which May Impact Care:  None  Strengths:  Ability to meet basic needs , Home prepared for child    Psychotropic Medications:         Pediatrician:     Parents have not yet decided on pediatrician  Pediatrician List:   [place]      Pediatrician Fax Number:    Risk Factors/Current Problems:      Cognitive State:  Alert , Able to Concentrate    Mood/Affect:  Calm , Comfortable    CSW Assessment: CSW received consult for MOB due to history of marijuana use. CSW met with MOB, FOB Patriciaann Clan, and newborn Victoria Turner at bedside to complete assessment. CSW  obtained permission from MOB to have discussion with FOB present. CSW educated MOB on hospital drug screening policies due to history of substance use. MOB stated understanding and did not have any concerns for results. MOB and newborn UDS both were negative. CSW explained to MOB that cord results would be followed and reported if necessary. MOB stated that this is her first child, she was attempting to latch baby and CSW obtained lactation assistance for MOB. MOB was adopted herself and has a supportive adoptive mother. MOB and FOB live with MOB's aunt, who is supportive and helpful. FOB reports that he works part time at [employer]. MOB delivered the infant via c-section due to MOB having a seizure. CSW inquired about MOB's seizure activity and she stated she takes medication to address those symptoms and had been seizure free for a while up until time of delivery. MOB reports that she has a new car seat for transportation of infant with knowledge of installation and use. MOB reports that infant will sleep in a crib, SIDS precautions were thoroughly reviewed with both parents. MOB receives [employer], [employer], and Medicaid. CSW and MOB discussed baby blues  period versus postpartum depression, MOB stated understanding. FOB asked appropriate questions regarding infant care, lactation assisted with answering questions. No further questions or concerns to address.  CSW will continue to follow cord and make mandated report if necessary.  CSW Plan/Description:  No Further Intervention Required/No Barriers to Discharge, CSW Will Continue to Monitor Umbilical Cord Tissue Drug Screen Results and Make Report if Earlie Counts, [place] 09/05/2018, 11:03 AM

## 2018-09-05 NOTE — Lactation Note (Signed)
This note was copied from a baby's chart. Lactation Consultation Note  Patient Name: Victoria Turner Today's Date: 09/05/2018 Reason for consult: Follow-up assessment;Infant < 6lbs;Late-preterm 34-36.6wks Mom recently started pumping and obtained a few mls of colostrum. Breasts comfortable.  Baby recently had formula feeding.  Instructed to put baby to breast with feeding cues and call for [facility] assist if needed, post pump every 2-3 hours x 15 minutes and bottle feed expressed milk/formula per volume guidelines.  Baby is doing well with bottle.  WIC referral sent to [facility] office.  Maternal Data    Feeding Feeding Type: Bottle Fed - Formula Nipple Type: Slow - flow  LATCH Score                   Interventions    Lactation Tools Discussed/Used     Consult Status Consult Status: Follow-up Date: 09/06/18 Follow-up type: In-patient    Huston Foley 09/05/2018, 9:02 AM

## 2018-09-05 NOTE — Progress Notes (Signed)
Daily Antepartum Note  Admission Date: 09/02/2018 Current Date: 09/05/2018 7:12 AM  Victoria Turner is a 23 y.o. G1P0101 POD#3 s/p stat pLTCS for fetal bradycardia after eclamptic seizure at [id number]  Pregnancy complicated by: Patient Active Problem List   Diagnosis Date Noted  . Ileus ([place]) 09/04/2018  . Severe preeclampsia, third trimester 09/02/2018  . Eclampsia 09/02/2018    Overnight/24hr events:  Continues to have BMs  Subjective:  Meeting all post op goals. Normal lochia  Objective:    Current Vital Signs 24h Vital Sign Ranges  T 98.6 F (37 C) Temp  Avg: 98.9 F (37.2 C)  Min: 98.3 F (36.8 C)  Max: 99.9 F (37.7 C)  BP 139/89 BP  Min: 123/87  Max: 141/84  HR (!) 114 Pulse  Avg: 112.8  Min: 108  Max: 118  RR 16 Resp  Avg: 17.2  Min: 16  Max: 20  SaO2 97 % Room Air SpO2  Avg: 97.3 %  Min: 95 %  Max: 100 %       24 Hour I/O Current Shift I/O  Time Ins Outs 02/29 0701 - 03/01 0700 In: 120 [P.O.:120] Out: 375 [Urine:375] No intake/output data recorded.   Patient Vitals for the past 24 hrs:  BP Temp Temp src Pulse Resp SpO2  09/05/18 0425 139/89 98.6 F (37 C) Oral (!) 114 16 97 %  09/04/18 2358 123/87 98.7 F (37.1 C) Oral (!) 116 16 99 %  09/04/18 1946 131/81 98.7 F (37.1 C) Oral (!) 109 16 100 %  09/04/18 1647 135/83 98.3 F (36.8 C) Oral (!) 108 17 99 %  09/04/18 1318 (!) 141/84 99.9 F (37.7 C) Oral (!) 112 18 -  09/04/18 1100 - - - - - 95 %  09/04/18 0900 - - - - - 96 %  09/04/18 0824 137/84 98.9 F (37.2 C) Oral (!) 118 20 95 %    Physical exam: General: Well nourished, well developed female in no acute distress. Abdomen: +BS, moderately distended. C/d/i bandage with small area of old blood. Dressing changed on 2/28, nttp Cardiovascular: S1, S2 normal, no murmur, rub or gallop, regular rate and rhythm Respiratory: CTAB Extremities: no clubbing, cyanosis or edema Skin: Warm and dry.   Medications: Current Facility-Administered  Medications  Medication Dose Route Frequency Provider Last Rate Last Dose  . chewing gum (ORBIT) sugar free  1 Stick Oral Q2H while awake Gerri Lins S, DO   1 Stick at 09/04/18 2000  . coconut oil  1 application Topical PRN Lavonia Drafts, MD      . witch hazel-glycerin (TUCKS) pad 1 application  1 application Topical PRN Lavonia Drafts, MD       And  . dibucaine (NUPERCAINAL) 1 % rectal ointment 1 application  1 application Rectal PRN Lavonia Drafts, MD      . diphenhydrAMINE (BENADRYL) capsule 25 mg  25 mg Oral Q6H PRN Lavonia Drafts, MD      . enalapril (VASOTEC) tablet 5 mg  5 mg Oral Daily Lavonia Drafts, MD   5 mg at 09/04/18 1000  . labetalol (NORMODYNE,TRANDATE) injection 20 mg  20 mg Intravenous PRN Christin Fudge, CNM   20 mg at 09/02/18 2119   And  . labetalol (NORMODYNE,TRANDATE) injection 40 mg  40 mg Intravenous PRN Christin Fudge, CNM   40 mg at 09/02/18 2130   And  . labetalol (NORMODYNE,TRANDATE) injection 80 mg  80 mg Intravenous PRN Christin Fudge, CNM   80 mg at 09/02/18 2143  And  . hydrALAZINE (APRESOLINE) injection 10 mg  10 mg Intravenous PRN Christin Fudge, CNM   10 mg at 09/02/18 2218  . measles, mumps & rubella vaccine (MMR) injection 0.5 mL  0.5 mL Subcutaneous Once Lavonia Drafts, MD      . menthol-cetylpyridinium (CEPACOL) lozenge 3 mg  1 lozenge Oral Q2H PRN Lavonia Drafts, MD   3 mg at 09/03/18 0006  . oxyCODONE (Oxy IR/ROXICODONE) immediate release tablet 5-10 mg  5-10 mg Oral Q4H PRN Aletha Halim, MD   5 mg at 09/04/18 2139  . polyethylene glycol (MIRALAX / GLYCOLAX) packet 17 g  17 g Oral Daily Aletha Halim, MD      . prenatal multivitamin tablet 1 tablet  1 tablet Oral Q1200 Lavonia Drafts, MD   1 tablet at 09/03/18 1143  . senna-docusate (Senokot-S) tablet 2 tablet  2 tablet Oral QHS PRN Aletha Halim, MD      . simethicone  (MYLICON) chewable tablet 80 mg  80 mg Oral QID Sloan Leiter, MD   80 mg at 09/04/18 2142  . Tdap (BOOSTRIX) injection 0.5 mL  0.5 mL Intramuscular Once Lavonia Drafts, MD        Labs:  Recent Labs  Lab 09/03/18 1700 09/04/18 0100 09/04/18 0622  WBC 14.4* 16.7* 18.0*  HGB 9.6* 8.0* 8.4*  HCT 28.3* 24.0* 25.2*  PLT 39* 68* 65*    Recent Labs  Lab 09/03/18 1200 09/04/18 0100 09/04/18 0622  NA 129* 134* 135  K 4.7 4.3 4.3  CL 105 105 104  CO2 19* 22 25  BUN '12 11 10  ' CREATININE 0.77 0.73 0.71  CALCIUM 7.0* 6.9* 7.0*  PROT 4.6* 4.8* 5.1*  BILITOT 1.1 1.6* 1.2  ALKPHOS 69 79 81  ALT 137* 91* 88*  AST 376* 143* 113*  GLUCOSE 89 95 89     Radiology: no new imaging  Assessment & Plan:  Pt doing well *PP: routine care. D/w pt re: BC tomorros *Eclampsia: doing well. F/u labs from this morning. Continue with vasotec *Anemia: no s/s of anemia. Start qday iron *PPx: SCDs *FEN/GI: regular diet, SLIV *Dispo: likely tomorrow  Aletha Halim, Brooke Bonito. MD Attending Center for [facility])

## 2018-09-05 NOTE — Lactation Note (Signed)
This note was copied from a baby's chart. Lactation Consultation Note  Patient Name: Victoria Turner Today's Date: 09/05/2018 Reason for consult: Follow-up assessment;Late-preterm 34-36.6wks;[place] Mom called out for assist.  Baby fussy.  Mom recently pumped 15 mls of transitional milk.  Positioned baby in cradle hold on left breast.  Nipples flat so tea cup hold used.  Baby latched and sustained latch for 5-6 sucks and then slipped off.  This was repeated several times.  Baby fell asleep so repostioned in football hold on right side.  Right breast less compressible.  Baby unable to grasp tissue so 20 mm nipple shield applied.  Baby became very fussy and would not latch with shield. Mom bottle fed baby 15 mls of expressed milk and baby tolerated well.  She was relaxed and content after feeding.  Instructed parents to give additional formula when baby still acting hungry.  Maternal Data    Feeding Feeding Type: Breast Fed Nipple Type: Slow - flow  LATCH Score Latch: Repeated attempts needed to sustain latch, nipple held in mouth throughout feeding, stimulation needed to elicit sucking reflex.  Audible Swallowing: None  Type of Nipple: Flat  Comfort (Breast/Nipple): Soft / non-tender  Hold (Positioning): Assistance needed to correctly position infant at breast and maintain latch.  LATCH Score: 5  Interventions Interventions: Assisted with latch;Position options;Skin to skin;Expressed milk;Breast massage;Hand express;Breast compression;DEBP  Lactation Tools Discussed/Used Tools: Nipple Shields Nipple shield size: 20   Consult Status Consult Status: Follow-up Date: 09/06/18 Follow-up type: In-patient    Huston Foley 09/05/2018, 10:36 AM

## 2018-09-06 LAB — PREPARE RBC (CROSSMATCH)

## 2018-09-06 LAB — COMPREHENSIVE METABOLIC PANEL
ALK PHOS: 94 U/L (ref 38–126)
ALT: 104 U/L — ABNORMAL HIGH (ref 0–44)
AST: 93 U/L — ABNORMAL HIGH (ref 15–41)
Albumin: 1.9 g/dL — ABNORMAL LOW (ref 3.5–5.0)
Anion gap: 7 (ref 5–15)
BILIRUBIN TOTAL: 0.5 mg/dL (ref 0.3–1.2)
BUN: 10 mg/dL (ref 6–20)
CO2: 24 mmol/L (ref 22–32)
Calcium: 7.6 mg/dL — ABNORMAL LOW (ref 8.9–10.3)
Chloride: 106 mmol/L (ref 98–111)
Creatinine, Ser: 0.65 mg/dL (ref 0.44–1.00)
GFR calc Af Amer: >60 mL/min (ref >60)
GFR calc non Af Amer: >60 mL/min (ref >60)
Glucose, Bld: 85 mg/dL (ref 70–99)
Potassium: 3.8 mmol/L (ref 3.5–5.1)
Sodium: 137 mmol/L (ref 135–145)
TOTAL PROTEIN: 4.9 g/dL — AB (ref 6.5–8.1)

## 2018-09-06 LAB — BPAM RBC
Blood Product Expiration Date: 202003222359
Blood Product Expiration Date: 202003222359
Blood Product Expiration Date: 202003252359
Blood Product Expiration Date: 202003252359
ISSUE DATE / TIME: 202002281552
ISSUE DATE / TIME: 202002290056
ISSUE DATE / TIME: 202002290822
ISSUE DATE / TIME: 202002281552
UNIT TYPE AND RH: 6200
Unit Type and Rh: 6200
Unit Type and Rh: 6200
Unit Type and Rh: 6200

## 2018-09-06 LAB — TYPE AND SCREEN
ABO/RH(D): A POS
Antibody Screen: NEGATIVE
Unit division: 0
Unit division: 0
Unit division: 0
Unit division: 0

## 2018-09-06 LAB — CBC
HCT: 20.3 % — ABNORMAL LOW (ref 36.0–46.0)
HCT: 38.7 % (ref 36.0–46.0)
HEMOGLOBIN: 6.7 g/dL — AB (ref 12.0–15.0)
Hemoglobin: 13.1 g/dL (ref 12.0–15.0)
MCH: 33.7 pg (ref 26.0–34.0)
MCH: 31.6 pg (ref 26.0–34.0)
MCHC: 33 g/dL (ref 30.0–36.0)
MCHC: 33.9 g/dL (ref 30.0–36.0)
MCV: 102 fL — ABNORMAL HIGH (ref 80.0–100.0)
MCV: 93.3 fL (ref 80.0–100.0)
Platelets: 88 10*3/uL — ABNORMAL LOW (ref 150–400)
Platelets: 111 10*3/uL — ABNORMAL LOW (ref 150–400)
RBC: 1.99 MIL/uL — AB (ref 3.87–5.11)
RBC: 4.15 MIL/uL (ref 3.87–5.11)
RDW: 14.2 % (ref 11.5–15.5)
RDW: 16.1 % — ABNORMAL HIGH (ref 11.5–15.5)
WBC: 18.5 10*3/uL — ABNORMAL HIGH (ref 4.0–10.5)
WBC: 18.6 10*3/uL — ABNORMAL HIGH (ref 4.0–10.5)
nRBC: 1.7 % — ABNORMAL HIGH (ref 0.0–0.2)
nRBC: 2.4 % — ABNORMAL HIGH (ref 0.0–0.2)

## 2018-09-06 LAB — BPAM FFP
Blood Product Expiration Date: 202003032359
ISSUE DATE / TIME: 202002281558
Unit Type and Rh: 600

## 2018-09-06 LAB — PREPARE FRESH FROZEN PLASMA

## 2018-09-06 MED ORDER — DIPHENHYDRAMINE HCL 25 MG PO CAPS
25.0000 mg | ORAL_CAPSULE | Freq: Once | ORAL | Status: AC
  Administered 2018-09-06: 25 mg via ORAL
  Filled 2018-09-06: qty 1

## 2018-09-06 MED ORDER — FUROSEMIDE 10 MG/ML IJ SOLN
20.0000 mg | Freq: Once | INTRAMUSCULAR | Status: AC
  Administered 2018-09-06: 20 mg via INTRAVENOUS
  Filled 2018-09-06: qty 2

## 2018-09-06 MED ORDER — SODIUM CHLORIDE 0.9% IV SOLUTION
Freq: Once | INTRAVENOUS | Status: AC
  Administered 2018-09-06: 08:00:00 via INTRAVENOUS

## 2018-09-06 MED ORDER — POLYETHYLENE GLYCOL 3350 17 G PO PACK: 17.0000 g | PACK | Freq: Every day | ORAL | 0 refills | Status: DC

## 2018-09-06 MED ORDER — OXYCODONE-ACETAMINOPHEN 5-325 MG PO TABS: 1.0000 | ORAL_TABLET | Freq: Four times a day (QID) | ORAL | 0 refills | Status: DC | PRN

## 2018-09-06 MED ORDER — ACETAMINOPHEN 500 MG PO TABS
1000.0000 mg | ORAL_TABLET | Freq: Once | ORAL | Status: AC
  Administered 2018-09-06: 1000 mg via ORAL
  Filled 2018-09-06: qty 2

## 2018-09-06 MED ORDER — ENALAPRIL MALEATE 5 MG PO TABS: 5.0000 mg | ORAL_TABLET | Freq: Every day | ORAL | 0 refills | Status: DC

## 2018-09-06 MED ORDER — SIMETHICONE 80 MG PO CHEW: 80.0000 mg | CHEWABLE_TABLET | Freq: Four times a day (QID) | ORAL | 0 refills | Status: DC | PRN

## 2018-09-06 NOTE — Progress Notes (Signed)
Daily Antepartum Note  Admission Date: 09/02/2018 Current Date: 09/06/2018 7:45 AM  Victoria Turner is a 23 y.o. G1P0101 POD#4 s/p stat pLTCS for fetal bradycardia after eclamptic seizure at 25DGU  Pregnancy complicated by: Patient Active Problem List   Diagnosis Date Noted  . Ileus ([place]) 09/04/2018  . Severe preeclampsia, third trimester 09/02/2018  . Eclampsia 09/02/2018    Overnight/24hr events:  Hemoglobin this morning is 6.7.  Oral iron therapy was ordered for hemoglobin of 7.1 yesterday.  Subjective:  Decreased pain overall, having BM.  Ambulating without difficulty. Normal lochia. Baby is stable at bedside, FOB in room.  Objective:    Current Vital Signs 24h Vital Sign Ranges  T 99.3 F (37.4 C) Temp  Avg: 98.4 F (36.9 C)  Min: 97.8 F (36.6 C)  Max: 99.3 F (37.4 C)  BP 128/89 BP  Min: 123/75  Max: 134/86  HR (!) 115 Pulse  Avg: 116.7  Min: 112  Max: 123  RR 17 Resp  Avg: 17.3  Min: 16  Max: 18  SaO2 96 % Room Air SpO2  Avg: 98.3 %  Min: 96 %  Max: 99 %       24 Hour I/O Current Shift I/O  Time Ins Outs No intake/output data recorded. No intake/output data recorded.   Patient Vitals for the past 24 hrs:  BP Temp Temp src Pulse Resp SpO2  09/06/18 0348 128/89 99.3 F (37.4 C) Oral (!) 115 17 96 %  09/05/18 2348 123/75 98 F (36.7 C) Oral (!) 123 16 99 %  09/05/18 1935 134/86 98.1 F (36.7 C) Oral (!) 112 17 99 %  09/05/18 1605 - 98.6 F (37 C) Oral - 18 99 %  09/05/18 1210 - 98.4 F (36.9 C) Oral - 18 -  09/05/18 0838 - 97.8 F (36.6 C) Oral - 18 -    Physical exam: General: Well nourished, well developed female in no acute distress. Abdomen: +BS, moderately distended. C/d/i bandage with small area of old blood. Dressing changed on 2/28, nttp Cardiovascular: S1, S2 normal, no murmur, rub or gallop, tachycardic rate and rhythm Respiratory: CTAB Extremities: no clubbing, cyanosis or edema Skin: Warm and dry.   Medications: Current  Facility-Administered Medications  Medication Dose Route Frequency Provider Last Rate Last Dose  . 0.9 %  sodium chloride infusion (Manually program via Guardrails IV Fluids)   Intravenous Once Amber Williard A, MD      . acetaminophen (TYLENOL) tablet 1,000 mg  1,000 mg Oral Once Sharetta Ricchio A, MD      . chewing gum (ORBIT) sugar free  1 Stick Oral Q2H while awake Gerri Lins S, DO   1 Stick at 09/05/18 2000  . coconut oil  1 application Topical PRN Lavonia Drafts, MD      . witch hazel-glycerin (TUCKS) pad 1 application  1 application Topical PRN Lavonia Drafts, MD       And  . dibucaine (NUPERCAINAL) 1 % rectal ointment 1 application  1 application Rectal PRN Lavonia Drafts, MD      . diphenhydrAMINE (BENADRYL) capsule 25 mg  25 mg Oral Q6H PRN Lavonia Drafts, MD      . diphenhydrAMINE (BENADRYL) capsule 25 mg  25 mg Oral Once Dajai Wahlert A, MD      . enalapril (VASOTEC) tablet 5 mg  5 mg Oral Daily Lavonia Drafts, MD   5 mg at 09/05/18 1100  . ferumoxytol (FERAHEME) 510 mg in sodium chloride 0.9 % 100 mL IVPB  510 mg Intravenous Once Lynnlee Revels, Sallyanne Havers, MD   Stopped at 09/06/18 0703  . labetalol (NORMODYNE,TRANDATE) injection 20 mg  20 mg Intravenous PRN Christin Fudge, CNM   20 mg at 09/02/18 2119   And  . labetalol (NORMODYNE,TRANDATE) injection 40 mg  40 mg Intravenous PRN Christin Fudge, CNM   40 mg at 09/02/18 2130   And  . labetalol (NORMODYNE,TRANDATE) injection 80 mg  80 mg Intravenous PRN Christin Fudge, CNM   80 mg at 09/02/18 2143   And  . hydrALAZINE (APRESOLINE) injection 10 mg  10 mg Intravenous PRN Christin Fudge, CNM   10 mg at 09/02/18 2218  . iron polysaccharides (NIFEREX) capsule 150 mg  150 mg Oral BID Keira Bohlin A, MD   150 mg at 09/05/18 2219  . measles, mumps & rubella vaccine (MMR) injection 0.5 mL  0.5 mL Subcutaneous Once Lavonia Drafts, MD      .  menthol-cetylpyridinium (CEPACOL) lozenge 3 mg  1 lozenge Oral Q2H PRN Lavonia Drafts, MD   3 mg at 09/03/18 0006  . oxyCODONE (Oxy IR/ROXICODONE) immediate release tablet 5-10 mg  5-10 mg Oral Q4H PRN Aletha Halim, MD   5 mg at 09/06/18 0432  . polyethylene glycol (MIRALAX / GLYCOLAX) packet 17 g  17 g Oral Daily Aletha Halim, MD      . prenatal multivitamin tablet 1 tablet  1 tablet Oral Q1200 Lavonia Drafts, MD   1 tablet at 09/05/18 1113  . senna-docusate (Senokot-S) tablet 2 tablet  2 tablet Oral QHS PRN Aletha Halim, MD      . simethicone (MYLICON) chewable tablet 80 mg  80 mg Oral QID Sloan Leiter, MD   80 mg at 09/05/18 2145  . Tdap (BOOSTRIX) injection 0.5 mL  0.5 mL Intramuscular Once Lavonia Drafts, MD        Labs:  Recent Labs  Lab 09/04/18 0622 09/05/18 0715 09/06/18 0540  WBC 18.0* 20.5* 18.5*  HGB 8.4* 7.1* 6.7*  HCT 25.2* 20.9* 20.3*  PLT 65* 60* 88*    Recent Labs  Lab 09/04/18 0622 09/05/18 0715 09/06/18 0540  NA 135 137 137  K 4.3 4.4 3.8  CL 104 106 106  CO2 '25 24 24  ' BUN '10 9 10  ' CREATININE 0.71 0.73 0.65  CALCIUM 7.0* 7.3* 7.6*  PROT 5.1* 4.6* 4.9*  BILITOT 1.2 1.0 0.5  ALKPHOS 81 79 94  ALT 88* 81* 104*  AST 113* 93* 93*  GLUCOSE 89 83 85   Radiology: no new imaging  Assessment & Plan:  Pt doing well but has anemia that is very concerning to her.  She desires transfusion after discussion about management of anemia. *Anemia: Will transfuse 3 units of PRBC, post transfusion hemoglbin to be done later. *PP: routine care.  *Eclampsia: doing well. Plts improving, LFTs plateauing.  Continue with vasotec *PPx: SCDs *FEN/GI: regular diet, SLIV *Dispo: likely today after transfusion, if remains stable  Verita Schneiders, MD Center for [employer] Fish farm manager)

## 2018-09-06 NOTE — Lactation Note (Signed)
This note was copied from a baby's chart. Lactation Consultation Note  Patient Name: Victoria Turner Today's Date: 09/06/2018 Reason for consult: Follow-up assessment;Late-preterm 34-36.6wks P1, 4 day old LPTI being discharge. LC did not observe a latch, per mom she is ready to leave and doesn't want to wait any longer. LC was called to issue mom a [facility] loaner breast pump. Per mom, she doesn't have $30 cash to pay for  [facility] loaner and she wasn't informed of the cost .  LC told mom she is sorry she did not know there was cost of if there was miscommunication. LC notice in earlier [facility] note that mom was informed of cost.  LC filled out [facility] breastfeeding Assistance Referral form and faxed to [facility] office. Per mom, she will call [facility] office later tomorrow after infant's pediatrician appointment regarding obtaining a [facility] loaner DEBP. .  Mom has been pumping and giving infant back EBM. Per mom, infant has not been latching well, mom will see Breastfeeding Peer Counselor in [facility] program. Mom plans to attend "Mom Talk"  Breastfeeding Support Group, phone and address given. LC discussed engorgement treatment and prevention. LC reinforced : O/P services, breastfeeding support groups, community resources, and our phone # for post-discharge questions.   Maternal Data Formula Feeding for Exclusion: Yes Reason for exclusion: Mother's choice to formula and breast feed on admission  Feeding Feeding Type: Bottle Fed - Formula Nipple Type: Slow - flow  LATCH Score                   Interventions    Lactation Tools Discussed/Used WIC Program: Yes   Consult Status Consult Status: Complete Date: 09/06/18 Follow-up type: In-patient    Danelle Earthly 09/06/2018, 10:31 PM

## 2018-09-06 NOTE — Lactation Note (Addendum)
This note was copied from a baby's chart. Lactation Consultation Note  Patient Name: Victoria Turner Today's Date: 09/06/2018 Reason for consult: Follow-up assessment;Primapara;1st time breastfeeding;Infant < 6lbs;Late-preterm 34-36.6wks  Post HELLP syndrome  Visited with P1 Mom of LPTI at 63 hrs old.  Baby at 6.8% weight loss.  Mom states she has been pumping as much as she can.  Obtained 75 ml last pumping.   Pump parts noted to be soaking in basin.  Reminded parents not to soak pump parts for longer than 5 mins.  It's best to wash, rinse, and air dry parts right after pumping, to enable parts to air dry before next pumping.   Mom bottle feeding baby her EBM.  Asked Mom if she plans to direct breast feed baby or did she plan to pump and bottle feed.  Mom stated she would rather just pump. WIC referral sent over the weekend.  Explained about the [facility] loaner program and need for $30 deposit.  Mom to let her RN know before being discharged, to let [facility] know. Mom receiving 3 units of PRBCs today, Hgb 6.7 .  Probable discharge either later today, or tomorrow.   Talked about OP lactation follow-up.  Mom didn't respond.  Will re-evaluate at later time.  Encouraged Mom to pump every 2-3 hrs for 15-20 mins.  Encouraged keeping baby STS when she is not sleeping.  Re-faxed [facility] referral  Consult Status Consult Status: Follow-up Date: 09/07/18 Follow-up type: In-patient    Judee Clara 09/06/2018, 8:19 AM

## 2018-09-06 NOTE — Discharge Instructions (Signed)
Hypertension   Hypertension is also called high blood pressure. High blood pressure means that the force of your blood moving in your body is too strong. When you are pregnant, this condition should be watched carefully. It can cause problems for you and your baby. Follow these instructions at home: Eating and drinking  Drink enough fluid to keep your pee (urine) pale yellow.  Avoid caffeine. Lifestyle  Do not use any products that contain nicotine or tobacco, such as cigarettes and e-cigarettes. If you need help quitting, ask your doctor.  Do not use alcohol or drugs.  Avoid stress.  Rest and get plenty of sleep. General instructions  Take over-the-counter and prescription medicines only as told by your doctor.  While lying down, lie on your left side. This keeps pressure off your major blood vessels.  While sitting or lying down, raise (elevate) your feet. Try putting some pillows under your lower legs.  Exercise regularly. Ask your doctor what kinds of exercise are best for you.  Keep all prenatal and follow-up visits as told by your doctor. This is important. Contact a doctor if:  You have symptoms that your doctor told you to watch for, such as: ? Throwing up (vomiting). ? Feeling sick to your stomach (nausea). ? Headache. Get help right away if you have:  Very bad belly pain that does not get better with treatment.  A very bad headache that does not get better.  Throwing up that does not get better with treatment.  Sudden, fast weight gain.  Sudden swelling in your hands, ankles, or face.  Fewer movements from your baby than usual.  Blurry vision.  Double vision.  Muscle twitching.  Sudden muscle tightening (spasms).  Trouble breathing.  Blue fingernails or lips. Summary  Hypertension is also called high blood pressure. High blood pressure means that the force of your blood moving in your body is too strong.  When you are pregnant, this condition  should be watched carefully. It can cause problems for you and your baby.  Get help right away if you have symptoms that your doctor told you to watch for. This information is not intended to replace advice given to you by your health care provider. Make sure you discuss any questions you have with your health care provider. Document Released: 07/26/2010 Document Revised: 06/09/2017 Document Reviewed: 03/04/2016 Elsevier Interactive Patient Education  2019 [employer] Inc[employer]   Cesarean Delivery, Care After Refer to this sheet in the next few weeks. These instructions provide you with information on caring for yourself after your procedure. Your health care provider may also give you specific instructions. Your treatment has been planned according to current medical practices, but problems sometimes occur. Call your health care provider if you have any problems or questions after you go home. HOME CARE INSTRUCTIONS  If you have an On-Q pump, remove it on the 5th day after your surgery, by removing the dressing/bandage and pulling the pump out. Cover the site where the pump strings came out with a band-aid, as needed.  Only take over-the-counter or prescription medications as directed by your health care provider.  Do not drink alcohol, especially if you are breastfeeding or taking medication to relieve pain.  Do not  smoke tobacco.  Continue to use good perineal care. Good perineal care includes:  Wiping your perineum from front to back.  Keeping your perineum clean.  Check your surgical cut (incision) daily for increased redness, drainage, swelling, or separation of skin.  Shower  and clean your incision gently with soap and water every day, by letting warm and soapy water run over the incision, and then pat it dry. If your health care provider says it is okay, leave the incision uncovered. Use a bandage (dressing) if the incision is draining fluid or appears irritated. If the adhesive strips  across the incision do not fall off within 7 days, carefully peel them off, after a shower.  Hug a pillow when coughing or sneezing until your incision is healed. This helps to relieve pain.  Do not use tampons, douches or have sexual intercourse, until your health care provider says it is okay.  Wear a well-fitting bra that provides breast support.  Limit wearing support panties or control-top hose.  Drink enough fluids to keep your urine clear or pale yellow.  Eat high-fiber foods such as whole grain cereals and breads, brown rice, beans, and fresh fruits and vegetables every day. These foods may help prevent or relieve constipation.  Resume activities such as climbing stairs, driving, lifting, exercising, or traveling as directed by your health care provider.  Try to have someone help you with your household activities and your newborn for at least a few days after you leave the hospital.  Rest as much as possible. Try to rest or take a nap when your newborn is sleeping.  Increase your activities gradually.  Do not lift more than 15lbs until directed by a provider.  Keep all of your scheduled postpartum appointments. It is very important to keep your scheduled follow-up appointments. At these appointments, your health care provider will be checking to make sure that you are healing physically and emotionally. SEEK MEDICAL CARE IF:   You are passing large clots from your vagina. Save any clots to show your health care provider.  You have a foul smelling discharge from your vagina.  You have trouble urinating.  You are urinating frequently.  You have pain when you urinate.  You have a change in your bowel movements.  You have increasing redness, pain, or swelling near your incision.  You have pus draining from your incision.  Your incision is separating.  You have painful, hard, or reddened breasts.  You have a severe headache.  You have blurred vision or see  spots.  You feel sad or depressed.  You have thoughts of hurting yourself or your newborn.  You have questions about your care, the care of your newborn, or medications.  You are dizzy or light-headed.  You have a rash.  You have pain, redness, or swelling at the site of the removed intravenous access (IV) tube.  You have nausea or vomiting.  You stopped breastfeeding and have not had a menstrual period within 12 weeks of stopping.  You are not breastfeeding and have not had a menstrual period within 12 weeks of delivery.  You have a fever. SEEK IMMEDIATE MEDICAL CARE IF:  You have persistent pain.  You have chest pain.  You have shortness of breath.  You faint.  You have leg pain.  You have stomach pain.  Your vaginal bleeding saturates 2 or more sanitary pads in 1 hour. MAKE SURE YOU:   Understand these instructions.  Will watch your condition.  Will get help right away if you are not doing well or get worse. Document Released: 03/15/2002 Document Revised: 11/07/2013 Document Reviewed: 02/18/2012 [facility] Patient Information 2015 [place], [place]. This information is not intended to replace advice given to you by your health care provider.  Make sure you discuss any questions you have with your health care provider. ° ° °

## 2018-09-07 ENCOUNTER — Encounter (HOSPITAL_COMMUNITY): Payer: Self-pay

## 2018-09-07 ENCOUNTER — Encounter: Payer: Self-pay | Admitting: Obstetrics & Gynecology

## 2018-09-07 ENCOUNTER — Encounter (HOSPITAL_COMMUNITY): Payer: Self-pay | Admitting: *Deleted

## 2018-09-07 LAB — BPAM RBC
BLOOD PRODUCT EXPIRATION DATE: 202003192359
BLOOD PRODUCT EXPIRATION DATE: 202003222359
Blood Product Expiration Date: 202003192359
ISSUE DATE / TIME: 202003020820
ISSUE DATE / TIME: 202003021132
ISSUE DATE / TIME: 202003021335
Unit Type and Rh: 6200
Unit Type and Rh: 6200
Unit Type and Rh: 6200

## 2018-09-07 LAB — TYPE AND SCREEN
ABO/RH(D): A POS
Antibody Screen: NEGATIVE
UNIT DIVISION: 0
Unit division: 0
Unit division: 0

## 2018-09-07 LAB — RPR: RPR: NONREACTIVE

## 2018-09-07 NOTE — Discharge Summary (Signed)
Obstetrical Discharge Summary  Date of Admission: 09/02/2018 Date of Discharge: 09/06/2018  Primary OB: [facility]  Gestational Age at Delivery: [redacted]w[redacted]d   Antepartum complications: Unknown; patient denied any. Prenatal records unavailable Reason for Admission: HELLP syndrome (AST/ALT) Date of Delivery: 09/02/18  Delivered By: Willodean Rosenthal Delivery Type: stat primary low transverse cesarean section Intrapartum complications/course: patient admitted due to pre-eclampsia with severe features (HA, BPs Anesthesia: general Placenta: Intact: yes. To pathology: yes.  Laceration: n/a Episiotomy: n/a EBL: Baby: Liveborn female, APGARs 4/7/8, weight 2760 g.    Discharge Diagnosis:  HELLP->Eclampsia Delivered (c-section) Anemia S/p ileus  Postpartum course:  *Postpartum: breast and bottle feeding. A POS.  *Eclampsia, HELLP: patient received 24 hours of PP Mg after delivery. Her platelets continued to trend down, starting in the 130s at delivery and eventually to the 30s postpartum. She received a platelet transfusion for two units due to concern for post op bleeding and her plts eventually were in the 110s by the time of discharge. She was never coagulopathic with just her PTT being slightly high at 40 with normal PT/INR and fibrinogen post op. Transaminases were stable at the time of discharge and her blood pressure controlled with enalapril during her postpartum course.  *Anemia: Patient received 3U prior to discharge. Concern for post operative bleeding that was managed expectantly.  *Ileus: diagnosed on CT scan and resolved (BMs and continued flatus, improved exam) with expectant management by the next day.   Discharge Vital Signs:  Current Vital Signs 24h Vital Sign Ranges  T 99.5 F (37.5 C) Temp  Avg: 99.3 F (37.4 C)  Min: 97.9 F (36.6 C)  Max: 100.3 F (37.9 C)  BP (!) 149/88 BP  Min: 149/88  Max: 167/96  HR (!) 103 Pulse  Avg: 104.8  Min: 101  Max: 110  RR 18  Resp  Avg: 17  Min: 16  Max: 18  SaO2 100 % Room Air SpO2  Avg: 99.8 %  Min: 99 %  Max: 100 %       24 Hour I/O Current Shift I/O  Time Ins Outs 03/02 0701 - 03/03 0700 In: 1235  Out: -  03/03 0701 - 03/03 1900 In: 1235  Out: -    Discharge Exam:  NAD Abdomen: firm fundus below the umbilicus, NTTP, mildly distended, +bowel sounds.  Incision c/d/i RRR no MRGs CTAB  Recent Labs  Lab 09/05/18 0715 09/06/18 0540 09/06/18 1914  WBC 20.5* 18.5* 18.6*  HGB 7.1* 6.7* 13.1  HCT 20.9* 20.3* 38.7  PLT 60* 88* 111*   CMP Latest Ref Rng & Units 09/06/2018 09/05/2018 09/04/2018  Glucose 70 - 99 mg/dL 85 83 89  BUN 6 - 20 mg/dL Creatinine 0.44 - 1.00 mg/dL 1.61 0.96 0.45  Sodium 135 - 145 mmol/L 137 137 135  Potassium 3.5 - 5.1 mmol/L 3.8 4.4 4.3  Chloride 98 - 111 mmol/L 106 106 104  CO2 22 - 32 mmol/L Calcium 8.9 - 10.3 mg/dL 7.6(L) 7.3(L) 7.0(L)  Total Protein 6.5 - 8.1 g/dL 4.9(L) 4.6(L) 5.1(L)  Total Bilirubin 0.3 - 1.2 mg/dL 0.5 1.0 1.2  Alkaline Phos 38 - 126 U/L 94 79 81  AST 15 - 41 U/L 93(H) 93(H) 113(H)  ALT 0 - 44 U/L 104(H) 81(H) 88(H)   Imaging: CLINICAL DATA:  Abdominal distention. Status post C-section on 09/02/2018  EXAM: CT ABDOMEN AND PELVIS WITH CONTRAST  TECHNIQUE: Multidetector CT imaging of the abdomen  and pelvis was performed using the standard protocol following bolus administration of intravenous contrast.  CONTRAST:  OMNIPAQUE IOHEXOL 300 MG/ML  SOLN  COMPARISON:  None  FINDINGS: Lower chest: Small bilateral pleural effusions are identified.  Hepatobiliary: No focal liver abnormality. Gallbladder negative. No biliary dilatation.  Pancreas: Unremarkable. No pancreatic ductal dilatation or surrounding inflammatory changes.  Spleen: Normal in size without focal abnormality.  Adrenals/Urinary Tract: Normal appearance of the adrenal glands. Mild bilateral pelvocaliectasis. No evidence for  high-grade obstructive uropathy. The urinary bladder is collapsed around a Foley catheter balloon.  Stomach/Bowel: Moderate distension of the stomach. Multiple air-filled loops of mid small bowel identified without pathologic dilatation. There is moderate distension of the colon to the level of the splenic flexure. Normal caliber descending colon, sigmoid colon and rectum.  Vascular/Lymphatic: Normal appearance of the abdominal aorta. The portal vein and hepatic veins are patent. No abdominopelvic adenopathy. No pelvic or inguinal adenopathy identified.  Reproductive: Postoperative changes from C-section identified. The endometrial cavity measures 1.3 cm in thickness. Within the lower uterine segment/cervix there is a filling defect which is hyperdense measuring 4.6 x 2.9 by 3.5 cm. There is hyperdense material pooling within the dome of the vagina, image 92/7. This may represent active bleeding from the uterus.  Other: Moderate volume of free fluid identified within the abdomen and pelvis. A hematocrit level is identified within along the right pericolic gutter reflecting the presence of blood products. Small volume of pneumoperitoneum is identified.  Musculoskeletal: There is hyperdense material within the anterior left side of pelvis ventral to the lower uterine segment and adjacent to the bladder measuring 4 cm and is favored to represent hematoma, image 69/3. Small hematoma within the ventral abdominal wall along the incision site measures 6.3 cm transverse 2.6 cm AP and 5.3 cm cranial caudal. No acute or significant osseous abnormality.  IMPRESSION: 1. There is diffuse gaseous distension of the mid small bowel loops and colon. In the early postoperative setting this is favored to represent postoperative ileus. A bowel obstruction is considered less favored. 2. Moderate fluid is identified within the abdomen and pelvis. Most of this fluid is low in attenuation.  However there is a hematocrit level identified within the dependent portion of the right pericolic gutter indicating a component of hemoperitoneum. Recommend careful clinical correlation. If there is a clinical evidence of active blood loss then surgical evaluation may be indicated to assess for active hemorrhage. 3. Heterogeneous mass within the anterior pelvis ventral to the lower uterine segment likely represents a small hematoma. 4. The edematous uterus has a heterogeneous and hyperdense mass in the lower uterine segment/cervix measuring up to 4.6 cm. This may represent either blood clot or retained products. There is pooling of presumed IV contrast material within the vaginal dome which may reflect extravasation secondary to active bleeding. 5. Intramuscular hematoma within the ventral abdominal wall at the incision site noted. 6. Critical Value/emergent results were called by telephone at the time of interpretation on 09/03/2018 at 6:01 pm to Dr. Leroy Libman, who verbally acknowledged these results.   Electronically Signed   By: Signa Kell M.D.   On: 09/03/2018 18:02  Disposition: Home  Rh Immune globulin given: not applicable  Contraception: not discussed with patient  Prenatal/Postnatal Panel: A POS/RPR negative  Plan:  [place] Harrington-Stewart was discharged to home in good condition. Patient in [place] for the time being so will set up appt for her with [place]  Follow-up appointment with W[facility] in 1 BP and  mood check   Future Appointments  Date Time Provider Department Center  09/13/2018  2:00 PM Hooper Bing, MD [facility]  09/13/2018  3:00 PM [facility] NURSE [facility]  10/06/2018  2:35 PM Willodean Rosenthal, MD [facility]    Discharge Medications: Allergies as of 09/06/2018      Reactions   Pollen Extract       Medication List    STOP taking these medications   docusate sodium 100 MG capsule Commonly known as:  COLACE     TAKE  these medications   enalapril 5 MG tablet Commonly known as:  VASOTEC Take 1 tablet (5 mg total) by mouth daily.   oxyCODONE-acetaminophen 5-325 MG tablet Commonly known as:  PERCOCET/ROXICET Take 1-2 tablets by mouth every 6 (six) hours as needed.   polyethylene glycol packet Commonly known as:  MIRALAX / GLYCOLAX Take 17 g by mouth daily.   prenatal multivitamin Tabs tablet Take 1 tablet by mouth daily at 12 noon.   simethicone 80 MG chewable tablet Commonly known as:  MYLICON Chew 1 tablet (80 mg total) by mouth 4 (four) times daily as needed for flatulence.       Cornelia Copa MD Attending Center for [facility])

## 2018-09-13 ENCOUNTER — Encounter: Payer: Self-pay | Admitting: Obstetrics and Gynecology

## 2018-09-13 ENCOUNTER — Ambulatory Visit: Payer: Medicaid Other

## 2018-09-14 ENCOUNTER — Ambulatory Visit: Payer: Self-pay

## 2018-09-15 ENCOUNTER — Other Ambulatory Visit: Payer: Self-pay

## 2018-09-15 ENCOUNTER — Ambulatory Visit: Payer: Medicaid Other

## 2018-09-15 VITALS — BP 134/84 | HR 126 | Wt 106.1 lb

## 2018-09-15 DIAGNOSIS — Z0130 Encounter for examination of blood pressure without abnormal findings: Secondary | ICD-10-CM

## 2018-09-15 MED ORDER — CYCLOBENZAPRINE HCL 10 MG PO TABS: 5.0000 mg | ORAL_TABLET | Freq: Three times a day (TID) | ORAL | 0 refills | Status: DC | PRN

## 2018-09-15 NOTE — Progress Notes (Addendum)
Pt here today for BP check and incision check.  BP 134/84.  Incision well-approximated, no odor, no drainage, and no redness.  Pt reports having back pain that is observed on her right side with some swelling.  Urinalysis completed.  Notified Dr. Vergie Living who recommends pt to take Flexeril 5 mg po q 8 hrs PRN for back pain and if not effective to schedule an appt in one week.  Notified pt of provider's recommendation, scheduled one week appt and advised pt to call and cancel appt if she gets better.  Pt agreed with no further questions.

## 2018-09-16 LAB — POCT URINALYSIS DIP (DEVICE)
GLUCOSE, UA: NEGATIVE mg/dL
Ketones, ur: 80 mg/dL — AB
Leukocytes,Ua: NEGATIVE
Nitrite: NEGATIVE
Protein, ur: >=300 mg/dL — AB
SPECIFIC GRAVITY, URINE: 1.025 (ref 1.005–1.030)
Urobilinogen, UA: 0.2 mg/dL (ref 0.0–1.0)
pH: 6 (ref 5.0–8.0)

## 2018-09-22 ENCOUNTER — Encounter: Payer: Self-pay | Admitting: Family Medicine

## 2018-09-22 ENCOUNTER — Ambulatory Visit: Payer: Medicaid Other | Admitting: Obstetrics & Gynecology

## 2018-10-05 ENCOUNTER — Telehealth: Payer: Self-pay | Admitting: Student

## 2018-10-05 NOTE — Telephone Encounter (Signed)
Called the patient to inform of the telephone visit scheduled for tomorrow. The patient stated she would like nexplannon for the birth control option. Stated she would like to be placed on birth control pills until she can schedule the appointment to complete the procedure as she does not want to come into the clinic at this time.

## 2018-10-06 ENCOUNTER — Ambulatory Visit (INDEPENDENT_AMBULATORY_CARE_PROVIDER_SITE_OTHER): Payer: Medicaid Other | Admitting: Obstetrics and Gynecology

## 2018-10-06 ENCOUNTER — Encounter: Payer: Self-pay | Admitting: Obstetrics and Gynecology

## 2018-10-06 ENCOUNTER — Other Ambulatory Visit: Payer: Self-pay

## 2018-10-06 ENCOUNTER — Telehealth: Payer: Self-pay | Admitting: Obstetrics and Gynecology

## 2018-10-06 DIAGNOSIS — Z9889 Other specified postprocedural states: Secondary | ICD-10-CM

## 2018-10-06 DIAGNOSIS — O159 Eclampsia, unspecified as to time period: Secondary | ICD-10-CM

## 2018-10-06 DIAGNOSIS — Z309 Encounter for contraceptive management, unspecified: Secondary | ICD-10-CM

## 2018-10-06 DIAGNOSIS — Z124 Encounter for screening for malignant neoplasm of cervix: Secondary | ICD-10-CM

## 2018-10-06 MED ORDER — NORETHINDRONE 0.35 MG PO TABS: 1.0000 | ORAL_TABLET | Freq: Every day | ORAL | 11 refills | Status: DC

## 2018-10-06 NOTE — Progress Notes (Signed)
I connected with  Victoria Turner on 10/06/18 at  2:35 PM EDT by telephone and verified that I am speaking with the correct person using two identifiers.   I discussed the limitations, risks, security and privacy concerns of performing an evaluation and management service by telephone and the availability of in person appointments. I also discussed with the patient that there may be a patient responsible charge related to this service. The patient expressed understanding and agreed to proceed.  Osvaldo Human, RN 10/06/2018  1:56 PM

## 2018-10-06 NOTE — Progress Notes (Signed)
TELEHEALTH VIRTUAL OBSTETRICS VISIT ENCOUNTER NOTE  I connected with Victoria Turner on 10/06/18 at  2:35 PM EDT by telephone at home and verified that I am speaking with the correct person using two identifiers.   I discussed the limitations, risks, security and privacy concerns of performing an evaluation and management service by telephone and the availability of in person appointments. I also discussed with the patient that there may be a patient responsible charge related to this service. The patient expressed understanding and agreed to proceed.  Appointment Date: 10/06/2018  OBGYN Clinic: [facility]  Primary Care Provider: Patient, No Pcp Per  Chief Complaint: No chief complaint on file.  History of Present Illness: Victoria Turner is a 23 y.o. African-American G1P0101 (No LMP recorded.), seen for the above chief complaint. Her past medical history is significant for n/a.   She is s/p stat 1CS on 09/02/18 at 36 weeks, secondary to eclamptic seizure with fetal bradycardia; she was discharged to home on POD#4. Pregnancy complicated by eclampsia at 36 weeks.   Complains of pink discharge from vagina, some pain around incision. Denies redness, tenderness or oozing at her incision, reports there is a scab on one side.   Vaginal bleeding or discharge: spotting Breast or formula feeding: bottle Intercourse: Yes  Contraception: wanted pills PP depression s/s: No  Any bowel or bladder issues: No  Pap smear: n/a, turned 22 during pregnancy  Review of Systems: Positive for n/a. Her 12 point review of systems is negative or as noted in the History of Present Illness.  Patient Active Problem List   Diagnosis Date Noted  . Ileus (HCC) 09/04/2018  . Severe preeclampsia, third trimester 09/02/2018  . Eclampsia 09/02/2018  . High risk sexual behavior 08/15/2015  . 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 Foster care (status) 01/12/2014  . Other seasonal allergic  rhinitis 01/12/2014   Medications Victoria Turner had no medications administered during this visit. No current outpatient medications on file.   No current facility-administered medications for this visit.    Allergies Pollen extract  Physical Exam:  General:  Alert, oriented and cooperative.   Mental Status: Normal mood and affect perceived. Normal judgment and thought content.  Rest of physical exam deferred due to type of encounter  PP Depression Screening:   Edinburgh Postnatal Depression Scale - 10/06/18 1352      Edinburgh Postnatal Depression Scale:  In the Past 7 Days   I have been able to laugh and see the funny side of things.  0    I have looked forward with enjoyment to things.  0    I have blamed myself unnecessarily when things went wrong.  0    I have been anxious or worried for no good reason.  0    I have felt scared or panicky for no good reason.  0    Things have been getting on top of me.  0    I have been so unhappy that I have had difficulty sleeping.  0    I have felt sad or miserable.  0    I have been so unhappy that I have been crying.  0    The thought of harming myself has occurred to me.  0    Edinburgh Postnatal Depression Scale Total  0       Edinburgh Postnatal Depression Scale - 10/06/18 1352      Edinburgh Postnatal Depression Scale:  In the Past 7 Days  I have been able to laugh and see the funny side of things.  0    I have looked forward with enjoyment to things.  0    I have blamed myself unnecessarily when things went wrong.  0    I have been anxious or worried for no good reason.  0    I have felt scared or panicky for no good reason.  0    Things have been getting on top of me.  0    I have been so unhappy that I have had difficulty sleeping.  0    I have felt sad or miserable.  0    I have been so unhappy that I have been crying.  0    The thought of harming myself has occurred to me.  0    Edinburgh Postnatal  Depression Scale Total  0      Assessment: Patient is a 23 y.o. G1P0101 who is 5 weeks post partum from a stat C-section for eclampsia and fetal bradycardia. She is doing well.   Plan:   1. Postoperative state Reports her incision is healing  2. Eclampsia - Patient reports she stopped taking her BP meds after her incision check bc she felt she did not need it anymore.  - denies symptoms - will arrange for BP check  3. Postpartum state Doing well  4. Encounter for contraceptive management, unspecified type Wanted OCPs (boyfriend is afraid that nexplanon will have poor side effects), reviewed that with h/o eclampsia, would not recommend estrogen containing contraception, reviewed other options, including depo, IUD, nexplanon, POPs, reviewed risks/benefits, she would like Nexplanon. Reviewed need to have no unprotected intercourse in two weeks prior to nexplanon insertion  5. Pap smear for cervical cancer screening Pt reports h/o pap, no records, will need pap at some point  RTC   I provided 13 minutes of non-face-to-face time during this encounter.  Baldemar Lenis, M.D. Attending [facility] [employer] [facility])

## 2018-10-06 NOTE — Telephone Encounter (Signed)
Contacted patient to get her scheduled for her BP check and to let her know that we have sent her BC pills to her pharmacy until we can get her in to get her nexplanon placed. Patient stated that she could not take BC pills because of her high blood pressure. After clarifying with Dr. Earlene Plater, she stated that she sent the [facility] pills that are safe for the patient to take temporarily. Patient Verbalized understanding of our conversation.

## 2018-10-06 NOTE — Addendum Note (Signed)
Addended by: Leroy Libman on: 10/06/2018 02:55 PM   Modules accepted: Orders

## 2018-10-06 NOTE — Telephone Encounter (Signed)
I called [place] and informed her provider would call her today for her televisit for postpartum visit and that is when she should discuss that she wants birth control pills.   She states she actually wants the nexplanon . I explained due to coronavirus we are limiting in office visits for now- she can discuss with provider today to get pills to last until she can come in to get nexplanon. She voices understanding.

## 2018-10-07 ENCOUNTER — Ambulatory Visit: Payer: Medicaid Other

## 2019-08-10 ENCOUNTER — Encounter (HOSPITAL_COMMUNITY): Payer: Self-pay

## 2019-08-10 ENCOUNTER — Other Ambulatory Visit: Payer: Self-pay

## 2019-08-10 ENCOUNTER — Emergency Department (HOSPITAL_COMMUNITY): Payer: Medicaid Other

## 2019-08-10 ENCOUNTER — Inpatient Hospital Stay (HOSPITAL_COMMUNITY)
Admission: EM | Admit: 2019-08-10 | Discharge: 2019-08-15 | DRG: 759 | Disposition: A | Discharge status: Home / Self Care | Payer: Medicaid Other | Attending: Family Medicine | Admitting: Family Medicine

## 2019-08-10 DIAGNOSIS — Z8619 Personal history of other infectious and parasitic diseases: Secondary | ICD-10-CM

## 2019-08-10 DIAGNOSIS — Z8249 Family history of ischemic heart disease and other diseases of the circulatory system: Secondary | ICD-10-CM

## 2019-08-10 DIAGNOSIS — Z7251 High risk heterosexual behavior: Secondary | ICD-10-CM

## 2019-08-10 DIAGNOSIS — N730 Acute parametritis and pelvic cellulitis: Secondary | ICD-10-CM | POA: Diagnosis present

## 2019-08-10 DIAGNOSIS — A749 Chlamydial infection, unspecified: Secondary | ICD-10-CM | POA: Diagnosis present

## 2019-08-10 DIAGNOSIS — N7093 Salpingitis and oophoritis, unspecified: Secondary | ICD-10-CM | POA: Diagnosis present

## 2019-08-10 DIAGNOSIS — Z79899 Other long term (current) drug therapy: Secondary | ICD-10-CM

## 2019-08-10 DIAGNOSIS — Z20822 Contact with and (suspected) exposure to covid-19: Secondary | ICD-10-CM | POA: Diagnosis present

## 2019-08-10 DIAGNOSIS — N7003 Acute salpingitis and oophoritis: Secondary | ICD-10-CM | POA: Diagnosis not present

## 2019-08-10 DIAGNOSIS — R079 Chest pain, unspecified: Secondary | ICD-10-CM | POA: Diagnosis not present

## 2019-08-10 DIAGNOSIS — Z818 Family history of other mental and behavioral disorders: Secondary | ICD-10-CM | POA: Diagnosis not present

## 2019-08-10 LAB — URINALYSIS, ROUTINE W REFLEX MICROSCOPIC
Bilirubin Urine: NEGATIVE
Glucose, UA: NEGATIVE mg/dL
Ketones, ur: 20 mg/dL — AB
Nitrite: NEGATIVE
Protein, ur: 30 mg/dL — AB
RBC / HPF: >50 RBC/hpf — ABNORMAL HIGH (ref 0–5)
Specific Gravity, Urine: >1.046 — ABNORMAL HIGH (ref 1.005–1.030)
WBC, UA: >50 WBC/hpf — ABNORMAL HIGH (ref 0–5)
pH: 6 (ref 5.0–8.0)

## 2019-08-10 LAB — COMPREHENSIVE METABOLIC PANEL
ALT: 7 U/L (ref 0–44)
AST: 15 U/L (ref 15–41)
Albumin: 4.2 g/dL (ref 3.5–5.0)
Alkaline Phosphatase: 60 U/L (ref 38–126)
Anion gap: 8 (ref 5–15)
BUN: 9 mg/dL (ref 6–20)
CO2: 26 mmol/L (ref 22–32)
Calcium: 9.1 mg/dL (ref 8.9–10.3)
Chloride: 103 mmol/L (ref 98–111)
Creatinine, Ser: 0.69 mg/dL (ref 0.44–1.00)
GFR calc Af Amer: >60 mL/min (ref >60)
GFR calc non Af Amer: >60 mL/min (ref >60)
Glucose, Bld: 82 mg/dL (ref 70–99)
Potassium: 3.7 mmol/L (ref 3.5–5.1)
Sodium: 137 mmol/L (ref 135–145)
Total Bilirubin: 0.9 mg/dL (ref 0.3–1.2)
Total Protein: 7.3 g/dL (ref 6.5–8.1)

## 2019-08-10 LAB — RESPIRATORY PANEL BY RT PCR (FLU A&B, COVID)
Influenza A by PCR: NEGATIVE
Influenza B by PCR: NEGATIVE
SARS Coronavirus 2 by RT PCR: NEGATIVE

## 2019-08-10 LAB — CBC WITH DIFFERENTIAL/PLATELET
Abs Immature Granulocytes: 0.04 10*3/uL (ref 0.00–0.07)
Basophils Absolute: 0 10*3/uL (ref 0.0–0.1)
Basophils Relative: 0 %
Eosinophils Absolute: 0 10*3/uL (ref 0.0–0.5)
Eosinophils Relative: 0 %
HCT: 41.7 % (ref 36.0–46.0)
Hemoglobin: 13.6 g/dL (ref 12.0–15.0)
Immature Granulocytes: 0 %
Lymphocytes Relative: 10 %
Lymphs Abs: 1.1 10*3/uL (ref 0.7–4.0)
MCH: 31.6 pg (ref 26.0–34.0)
MCHC: 32.6 g/dL (ref 30.0–36.0)
MCV: 96.8 fL (ref 80.0–100.0)
Monocytes Absolute: 0.4 10*3/uL (ref 0.1–1.0)
Monocytes Relative: 3 %
Neutro Abs: 9.5 10*3/uL — ABNORMAL HIGH (ref 1.7–7.7)
Neutrophils Relative %: 87 %
Platelets: 264 10*3/uL (ref 150–400)
RBC: 4.31 MIL/uL (ref 3.87–5.11)
RDW: 12.2 % (ref 11.5–15.5)
WBC: 11 10*3/uL — ABNORMAL HIGH (ref 4.0–10.5)
nRBC: 0 % (ref 0.0–0.2)

## 2019-08-10 LAB — WET PREP, GENITAL
Clue Cells Wet Prep HPF POC: NONE SEEN
Sperm: NONE SEEN
Trich, Wet Prep: NONE SEEN
Yeast Wet Prep HPF POC: NONE SEEN

## 2019-08-10 LAB — I-STAT BETA HCG BLOOD, ED (MC, WL, AP ONLY): I-stat hCG, quantitative: <5 m[IU]/mL (ref <5)

## 2019-08-10 LAB — LIPASE, BLOOD: Lipase: 18 U/L (ref 11–51)

## 2019-08-10 MED ORDER — SODIUM CHLORIDE 0.9 % IV SOLN
2.0000 g | Freq: Four times a day (QID) | INTRAVENOUS | Status: DC
  Administered 2019-08-11 – 2019-08-14 (×14): 2 g via INTRAVENOUS
  Filled 2019-08-10 (×16): qty 2

## 2019-08-10 MED ORDER — SODIUM CHLORIDE 0.9 % IV BOLUS
1000.0000 mL | Freq: Once | INTRAVENOUS | Status: AC
  Administered 2019-08-10: 1000 mL via INTRAVENOUS

## 2019-08-10 MED ORDER — MORPHINE SULFATE (PF) 2 MG/ML IV SOLN: 2.0000 mg | Freq: Once | INTRAVENOUS | Status: DC

## 2019-08-10 MED ORDER — FENTANYL CITRATE (PF) 100 MCG/2ML IJ SOLN
25.0000 ug | Freq: Once | INTRAMUSCULAR | Status: AC
  Administered 2019-08-10: 14:00:00 25 ug via INTRAVENOUS
  Filled 2019-08-10: qty 2

## 2019-08-10 MED ORDER — ENALAPRIL MALEATE 5 MG PO TABS
5.0000 mg | ORAL_TABLET | Freq: Every day | ORAL | Status: DC
  Filled 2019-08-10 (×2): qty 1

## 2019-08-10 MED ORDER — TRAMADOL HCL 50 MG PO TABS
50.0000 mg | ORAL_TABLET | Freq: Four times a day (QID) | ORAL | Status: DC | PRN
  Administered 2019-08-10 – 2019-08-15 (×7): 50 mg via ORAL
  Filled 2019-08-10 (×7): qty 1

## 2019-08-10 MED ORDER — IOHEXOL 300 MG/ML  SOLN
80.0000 mL | Freq: Once | INTRAMUSCULAR | Status: AC | PRN
  Administered 2019-08-10: 18:00:00 80 mL via INTRAVENOUS

## 2019-08-10 MED ORDER — IBUPROFEN 400 MG PO TABS
600.0000 mg | ORAL_TABLET | Freq: Four times a day (QID) | ORAL | Status: DC | PRN
  Administered 2019-08-12 – 2019-08-15 (×4): 600 mg via ORAL
  Filled 2019-08-10 (×4): qty 1

## 2019-08-10 MED ORDER — SODIUM CHLORIDE 0.9 % IV SOLN
100.0000 mg | Freq: Two times a day (BID) | INTRAVENOUS | Status: DC
  Administered 2019-08-11 – 2019-08-14 (×7): 100 mg via INTRAVENOUS
  Filled 2019-08-10 (×8): qty 100

## 2019-08-10 MED ORDER — DOXYCYCLINE HYCLATE 100 MG PO TABS
100.0000 mg | ORAL_TABLET | Freq: Once | ORAL | Status: AC
  Administered 2019-08-10: 100 mg via ORAL
  Filled 2019-08-10: qty 1

## 2019-08-10 MED ORDER — ALUM & MAG HYDROXIDE-SIMETH 200-200-20 MG/5ML PO SUSP
30.0000 mL | ORAL | Status: DC | PRN
  Filled 2019-08-10: qty 30

## 2019-08-10 MED ORDER — GUAIFENESIN 100 MG/5ML PO SOLN: 15.0000 mL | ORAL | Status: DC | PRN

## 2019-08-10 MED ORDER — MENTHOL 3 MG MT LOZG: 1.0000 | LOZENGE | OROMUCOSAL | Status: DC | PRN

## 2019-08-10 MED ORDER — ONDANSETRON HCL 4 MG/2ML IJ SOLN
4.0000 mg | Freq: Once | INTRAMUSCULAR | Status: AC
  Administered 2019-08-10: 4 mg via INTRAVENOUS
  Filled 2019-08-10: qty 2

## 2019-08-10 MED ORDER — PROMETHAZINE HCL 25 MG/ML IJ SOLN
12.5000 mg | Freq: Four times a day (QID) | INTRAMUSCULAR | Status: DC | PRN
  Administered 2019-08-11 – 2019-08-12 (×3): 12.5 mg via INTRAVENOUS
  Filled 2019-08-10 (×3): qty 1

## 2019-08-10 MED ORDER — SODIUM CHLORIDE 0.9 % IV SOLN
2.0000 g | Freq: Once | INTRAVENOUS | Status: AC
  Administered 2019-08-10: 2 g via INTRAVENOUS
  Filled 2019-08-10: qty 2

## 2019-08-10 MED ORDER — ENOXAPARIN SODIUM 30 MG/0.3ML ~~LOC~~ SOLN
30.0000 mg | SUBCUTANEOUS | Status: DC
  Administered 2019-08-11: 30 mg via SUBCUTANEOUS
  Filled 2019-08-10: qty 0.3

## 2019-08-10 NOTE — ED Provider Notes (Signed)
[facility] EMERGENCY DEPARTMENT Provider Note   CSN: [id number] Arrival date & time: 08/10/19  1218     History Chief Complaint  Patient presents with  . Nausea  . Emesis    Victoria Turner is a 24 y.o. female with a past medical history of HPV, who presents today for evaluation of nausea and vomiting.  She reports that her nausea started at about 9 while she was giving her daughter a bath.  She then started vomiting.  She states that she has vomited between 5-10 times however her vomiting stopped when EMS got there.  She states she takes medicine for high blood pressure however has not taken it today.  She denies any constipation or diarrhea.  No fevers.  She states she is currently on day 3 of her current menstrual cycle.  She denies any abnormal discharge.  No dysuria, increased frequency or urgency.  The pain is mostly in her right lower quadrant.    HPI     Past Medical History:  Diagnosis Date  . Allergy    seasonal allergies  . GERD (gastroesophageal reflux disease)   . History of heavy periods   . HPV (human papilloma virus) infection   . Medical history non-contributory     Patient Active Problem List   Diagnosis Date Noted  . Ileus (HCC) 09/04/2018  . Severe preeclampsia, third trimester 09/02/2018  . Eclampsia 09/02/2018  . High risk sexual behavior 08/15/2015  . 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 Other seasonal allergic rhinitis 01/12/2014    Past Surgical History:  Procedure Laterality Date  . CESAREAN SECTION N/A 09/02/2018   Procedure: CESAREAN SECTION;  Surgeon: Willodean Rosenthal, MD;  Location: [facility] LD ORS;  Service: Obstetrics;  Laterality: N/A;  . NO PAST SURGERIES       OB History    Gravida  1   Para  1   Term  0   Preterm  1   AB  0   Living  1     SAB  0   TAB  0   Ectopic  0   Multiple      Live Births  1           Family History  Problem Relation Age of Onset  .  Hypertension Mother   . Autism Sister   . Autism Brother   . Autism Sister   . Autism Sister   . ADD / ADHD Sister     Social History   Tobacco Use  . Smoking status: Never Smoker  . Smokeless tobacco: Never Used  Substance Use Topics  . Alcohol use: Never  . Drug use: Not Currently    Types: Marijuana    Home Medications Prior to Admission medications   Medication Sig Start Date End Date Taking? Authorizing Provider  enalapril (VASOTEC) 5 MG tablet Take 5 mg by mouth daily.   Yes [provider]  norethindrone (MICRONOR,CAMILA,ERRIN) 0.35 MG tablet Take 1 tablet (0.35 mg total) by mouth daily. Patient not taking: Reported on 08/10/2019 10/06/18   Conan Bowens, MD    Allergies    Pollen extract  Review of Systems   Review of Systems  Constitutional: Negative for chills, fatigue and fever.  HENT: Negative for congestion.   Eyes: Negative for visual disturbance.  Respiratory: Negative for cough, chest tightness and shortness of breath.   Cardiovascular: Negative for chest pain and palpitations.  Gastrointestinal: Positive for abdominal pain, nausea  and vomiting. Negative for constipation and diarrhea.  Genitourinary: Negative for dysuria, frequency, pelvic pain and urgency.  Musculoskeletal: Negative for back pain and neck pain.  Neurological: Negative for headaches.  Psychiatric/Behavioral: Negative for confusion.  All other systems reviewed and are negative.   Physical Exam Updated Vital Signs BP 103/71   Pulse 93   Temp 99.3 F (37.4 C) (Oral)   Resp 16   Ht 5\' 2"  (1.575 m)   Wt 40.8 kg   SpO2 99%   BMI 16.46 kg/m   Physical Exam Vitals and nursing note reviewed. Exam conducted with a chaperone present.  Constitutional:      General: She is not in acute distress.    Appearance: She is well-developed.  HENT:     Head: Normocephalic and atraumatic.  Eyes:     Conjunctiva/sclera: Conjunctivae normal.  Cardiovascular:     Rate and Rhythm: Normal  rate and regular rhythm.     Pulses: Normal pulses.     Heart sounds: Normal heart sounds. No murmur.  Pulmonary:     Effort: Pulmonary effort is normal. No respiratory distress.     Breath sounds: Normal breath sounds.  Abdominal:     General: There is no distension.     Palpations: Abdomen is soft.     Tenderness: There is abdominal tenderness (RLQ). There is rebound (RLQ).  Genitourinary:    Vagina: No vaginal discharge or lesions.     Cervix: Cervical motion tenderness present.     Adnexa:        Right: Tenderness present.        Left: Tenderness present.      Comments: Normal external female genitalia. There is bleeding from the cervix.  Cervical opening is closed. Musculoskeletal:     Cervical back: Normal range of motion and neck supple.  Skin:    General: Skin is warm and dry.  Neurological:     General: No focal deficit present.     Mental Status: She is alert.  Psychiatric:        Mood and Affect: Mood normal.        Behavior: Behavior normal.     ED Results / Procedures / Treatments   Labs (all labs ordered are listed, but only abnormal results are displayed) Labs Reviewed  WET PREP, GENITAL - Abnormal; Notable for the following components:      Result Value   WBC, Wet Prep HPF POC MANY (*)    All other components within normal limits  CBC WITH DIFFERENTIAL/PLATELET - Abnormal; Notable for the following components:   WBC 11.0 (*)    Neutro Abs 9.5 (*)    All other components within normal limits  RESPIRATORY PANEL BY RT PCR (FLU A&B, COVID)  COMPREHENSIVE METABOLIC PANEL  LIPASE, BLOOD  URINALYSIS, ROUTINE W REFLEX MICROSCOPIC  I-STAT BETA HCG BLOOD, ED ([facility], [facility], [facility] ONLY)  GC/CHLAMYDIA PROBE AMP ([place]) NOT AT [facility]    EKG None  Radiology [facility] Transvaginal Non-OB  Result Date: 08/10/2019 CLINICAL DATA:  Findings suspicious for pelvic inflammatory disease on recent CT examination. EXAM: TRANSABDOMINAL AND TRANSVAGINAL ULTRASOUND OF PELVIS DOPPLER  ULTRASOUND OF OVARIES TECHNIQUE: Both transabdominal and transvaginal ultrasound examinations of the pelvis were performed. Transabdominal technique was performed for global imaging of the pelvis including uterus, ovaries, adnexal regions, and pelvic cul-de-sac. It was necessary to proceed with endovaginal exam following the transabdominal exam to visualize the ovaries. Color and duplex Doppler ultrasound was utilized to evaluate blood flow  to the ovaries. COMPARISON:  CT from earlier in the same day. FINDINGS: Uterus Measurements: 8.3 x 3.7 x 5.2 cm. = volume: 83 mL. No fibroids or other mass visualized. Endometrium Thickness: 9 mm.  No focal abnormality visualized. Right ovary Measurements: 4.5 x 2.9 x 3.9 cm. = volume: 27 mL. Increased vascularity is noted. The entire ovary is involved in a mixed echogenicity pattern which corresponds with that seen on prior CT examination. Left ovary Measurements: 7.8 x 4.6 x 5.0 cm. = volume: 92 mL. Complex cystic lesion is noted with internal echoes measuring 5.2 x 3.8 x 4.2 cm. This corresponds to the large fluid attenuation lesion on recent CT examination. Pulsed Doppler evaluation of both ovaries demonstrates normal low-resistance arterial and venous waveforms. Other findings Mild free fluid pelvic fluid is seen. IMPRESSION: Large complex cystic lesion is noted within the left ovary measuring 5.2 cm. This corresponds to that seen on the prior CT examination. This may represent a hemorrhagic cyst although the possibility of pelvic inflammatory disease deserves consideration. Complex appearing lesion within the right ovary similar to that seen on prior CT examination. The dilated tubular structure seen on the prior exam is not as well appreciated on this study and may be related to a focally fluid-filled loop of small bowel. The changes in the right adnexa again suggest pelvic inflammatory disease. Follow-up examination following completion of appropriate antibiotic therapy  is recommended. Electronically Signed   By: Inez Catalina M.D.   On: 08/10/2019 20:12   US Pelvis Complete  Result Date: 08/10/2019 CLINICAL DATA:  Findings suspicious for pelvic inflammatory disease on recent CT examination. EXAM: TRANSABDOMINAL AND TRANSVAGINAL ULTRASOUND OF PELVIS DOPPLER ULTRASOUND OF OVARIES TECHNIQUE: Both transabdominal and transvaginal ultrasound examinations of the pelvis were performed. Transabdominal technique was performed for global imaging of the pelvis including uterus, ovaries, adnexal regions, and pelvic cul-de-sac. It was necessary to proceed with endovaginal exam following the transabdominal exam to visualize the ovaries. Color and duplex Doppler ultrasound was utilized to evaluate blood flow to the ovaries. COMPARISON:  CT from earlier in the same day. FINDINGS: Uterus Measurements: 8.3 x 3.7 x 5.2 cm. = volume: 83 mL. No fibroids or other mass visualized. Endometrium Thickness: 9 mm.  No focal abnormality visualized. Right ovary Measurements: 4.5 x 2.9 x 3.9 cm. = volume: 27 mL. Increased vascularity is noted. The entire ovary is involved in a mixed echogenicity pattern which corresponds with that seen on prior CT examination. Left ovary Measurements: 7.8 x 4.6 x 5.0 cm. = volume: 92 mL. Complex cystic lesion is noted with internal echoes measuring 5.2 x 3.8 x 4.2 cm. This corresponds to the large fluid attenuation lesion on recent CT examination. Pulsed Doppler evaluation of both ovaries demonstrates normal low-resistance arterial and venous waveforms. Other findings Mild free fluid pelvic fluid is seen. IMPRESSION: Large complex cystic lesion is noted within the left ovary measuring 5.2 cm. This corresponds to that seen on the prior CT examination. This may represent a hemorrhagic cyst although the possibility of pelvic inflammatory disease deserves consideration. Complex appearing lesion within the right ovary similar to that seen on prior CT examination. The dilated  tubular structure seen on the prior exam is not as well appreciated on this study and may be related to a focally fluid-filled loop of small bowel. The changes in the right adnexa again suggest pelvic inflammatory disease. Follow-up examination following completion of appropriate antibiotic therapy is recommended. Electronically Signed   By: Linus Mako.D.  On: 08/10/2019 20:12   CT ABDOMEN PELVIS W CONTRAST  Result Date: 08/10/2019 CLINICAL DATA:  24 year old female with right lower quadrant abdominal pain. EXAM: CT ABDOMEN AND PELVIS WITH CONTRAST TECHNIQUE: Multidetector CT imaging of the abdomen and pelvis was performed using the standard protocol following bolus administration of intravenous contrast. CONTRAST:  68mL OMNIPAQUE IOHEXOL 300 MG/ML  SOLN COMPARISON:  CT abdomen pelvis dated 10/15/2011. FINDINGS: Lower chest: The visualized lung bases are clear. No intra-abdominal free air. Probable small free fluid in the pelvis. Hepatobiliary: No focal liver abnormality is seen. No gallstones, gallbladder wall thickening, or biliary dilatation. Pancreas: Unremarkable. No pancreatic ductal dilatation or surrounding inflammatory changes. Spleen: Normal in size without focal abnormality. Adrenals/Urinary Tract: The adrenal glands, kidneys, visualized ureters appear unremarkable. The urinary bladder is collapsed. Stomach/Bowel: There is no bowel obstruction. Mildly thickened appearance of the small bowel loops, likely related to underdistention or reactive to inflammatory changes of the pelvis. The appendix is suboptimally visualized. A small pocket of air within a tubular appearing structure in the right lower quadrant (series 2, image 41 and coronal series 5, image 55) likely represents air within a normal appendix. Vascular/Lymphatic: The abdominal aorta and IVC are unremarkable. No portal venous gas. No adenopathy. Reproductive: The uterus is anteverted. There is a 5.8 x 4.1 cm cystic structure in the left  adnexa concerning for an infected collection/abscess or infected ovarian cyst/follicle. Dilated fluid-filled tubular structures noted in the right adnexa. There is a cluster of multiloculated/multi cystic structure in the right adnexa measuring approximately 4.4 x 3.1 cm. Findings likely represent dilated fallopian tubes with inflammatory/infectious changes. Other: None Musculoskeletal: No acute or significant osseous findings. IMPRESSION: Constellation of findings concerning for pelvic inflammatory disease or tubo-ovarian abscess. Clinical correlation recommended. Pelvic ultrasound may provide better evaluation. Electronically Signed   By: Elgie Collard M.D.   On: 08/10/2019 18:22   [place] Art/Ven Flow Abd Pelv Doppler  Result Date: 08/10/2019 CLINICAL DATA:  Findings suspicious for pelvic inflammatory disease on recent CT examination. EXAM: TRANSABDOMINAL AND TRANSVAGINAL ULTRASOUND OF PELVIS DOPPLER ULTRASOUND OF OVARIES TECHNIQUE: Both transabdominal and transvaginal ultrasound examinations of the pelvis were performed. Transabdominal technique was performed for global imaging of the pelvis including uterus, ovaries, adnexal regions, and pelvic cul-de-sac. It was necessary to proceed with endovaginal exam following the transabdominal exam to visualize the ovaries. Color and duplex Doppler ultrasound was utilized to evaluate blood flow to the ovaries. COMPARISON:  CT from earlier in the same day. FINDINGS: Uterus Measurements: 8.3 x 3.7 x 5.2 cm. = volume: 83 mL. No fibroids or other mass visualized. Endometrium Thickness: 9 mm.  No focal abnormality visualized. Right ovary Measurements: 4.5 x 2.9 x 3.9 cm. = volume: 27 mL. Increased vascularity is noted. The entire ovary is involved in a mixed echogenicity pattern which corresponds with that seen on prior CT examination. Left ovary Measurements: 7.8 x 4.6 x 5.0 cm. = volume: 92 mL. Complex cystic lesion is noted with internal echoes measuring 5.2 x 3.8 x 4.2  cm. This corresponds to the large fluid attenuation lesion on recent CT examination. Pulsed Doppler evaluation of both ovaries demonstrates normal low-resistance arterial and venous waveforms. Other findings Mild free fluid pelvic fluid is seen. IMPRESSION: Large complex cystic lesion is noted within the left ovary measuring 5.2 cm. This corresponds to that seen on the prior CT examination. This may represent a hemorrhagic cyst although the possibility of pelvic inflammatory disease deserves consideration. Complex appearing lesion within the right ovary similar  to that seen on prior CT examination. The dilated tubular structure seen on the prior exam is not as well appreciated on this study and may be related to a focally fluid-filled loop of small bowel. The changes in the right adnexa again suggest pelvic inflammatory disease. Follow-up examination following completion of appropriate antibiotic therapy is recommended. Electronically Signed   By: Alcide CleverMark  Lukens M.D.   On: 08/10/2019 20:12    Procedures Procedures (including critical care time)  Medications Ordered in ED Medications  cefOXitin (MEFOXIN) 2 g in sodium chloride 0.9 % 100 mL IVPB (2 g Intravenous New Bag/Given 08/10/19 2018)  sodium chloride 0.9 % bolus 1,000 mL (0 mLs Intravenous Stopped 08/10/19 1354)  fentaNYL (SUBLIMAZE) injection 25 mcg (25 mcg Intravenous Given 08/10/19 1353)  ondansetron (ZOFRAN) injection 4 mg (4 mg Intravenous Given 08/10/19 1714)  iohexol (OMNIPAQUE) 300 MG/ML solution 80 mL (80 mLs Intravenous Contrast Given 08/10/19 1732)  doxycycline (VIBRA-TABS) tablet 100 mg (100 mg Oral Given 08/10/19 2023)    ED Course  I have reviewed the triage vital signs and the nursing notes.  Pertinent labs & imaging results that were available during my care of the patient were reviewed by me and considered in my medical decision making (see chart for details).  Clinical Course as of Aug 09 2024  Wed Aug 10, 2019  1834 Cefoxitin doxy    [EH]  [id number] I spoke with Dr. Shawnie PonsPratt from [facility] who recommends starting patient on cefoxitin and doxy, and obtaining ultrasound.   [EH]  2019 Spoke with Dr. Shawnie PonsPratt of [facility] who will see patient for admission.    [EH]    Clinical Course User Index [EH] Cristina GongHammond, Boneta Standre W, PA-C   MDM Rules/Calculators/A&P                     Patient presents today for evaluation of vomiting and pain on the sides of her abdomen.  She reports that the pain started after the vomiting.  She vomited about 5 times prior to arrival.  On exam she had right lower quadrant abdominal tenderness.  Labs were obtained and reviewed, mild leukocytosis at 11.  CMP is unremarkable, pregnancy test is negative.  Lipase is normal.  Wet prep shows many white blood cells.  On pelvic exam patient had cervical motion tenderness along with tenderness in the bilateral adnexa right greater than left.  CT scan was obtained showing concerns for PID/TOA without evidence of acute appendicitis. I spoke with Dr. Shawnie PonsPratt of [facility] who recommends starting patient on cefoxitin and doxy along with obtaining pelvic ultrasound.  Pelvic ultrasound shows cysts bilaterally with concern for pelvic inflammatory disease primarily on the right side.  I spoke with Dr. Shawnie PonsPratt who will see patient for admission.  Covid test was ordered.  Gonorrhea chlamydia testing was sent with pelvic exam.  Note: Portions of this report may have been transcribed using voice recognition software. Every effort was made to ensure accuracy; however, inadvertent computerized transcription errors may be present  Final Clinical Impression(s) / ED Diagnoses Final diagnoses:  PID (acute pelvic inflammatory disease)    Rx / DC Orders ED Discharge Orders    None       Norman ClayHammond, Sayvon Arterberry W, PA-C 08/10/19 2204    Pricilla LovelessGoldston, Scott, MD 08/11/19 [phone]

## 2019-08-10 NOTE — ED Notes (Signed)
Requested Mefoxin from main pharmacy

## 2019-08-10 NOTE — H&P (Signed)
Victoria Turner is an 24 y.o. G53P0101 female.   Chief Complaint: N/V HPI: On menses. G1P0101 came to ED with N/V since last pm. Notes increasing abdominal pain since arrival. Wet prep shows large WBCs. Afebrile. Elevated WBC. + CMT. U/S reveals bilateral TOAs.  Past Medical History:  Diagnosis Date  . Allergy    seasonal allergies  . GERD (gastroesophageal reflux disease)   . History of heavy periods   . HPV (human papilloma virus) infection   . Medical history non-contributory     Past Surgical History:  Procedure Laterality Date  . CESAREAN SECTION N/A 09/02/2018   Procedure: CESAREAN SECTION;  Surgeon: Willodean Rosenthal, MD;  Location: [facility];  Service: Obstetrics;  Laterality: N/A;  . NO PAST SURGERIES      Family History  Problem Relation Age of Onset  . Hypertension Mother   . Autism Sister   . Autism Brother   . Autism Sister   . Autism Sister   . ADD / ADHD Sister    Social History:  reports that she has never smoked. She has never used smokeless tobacco. She reports previous drug use. Drug: Marijuana. She reports that she does not drink alcohol.  Allergies:  Allergies  Allergen Reactions  . Pollen Extract     (Not in a hospital admission)   Pertinent items are noted in HPI.  Blood pressure 103/71, pulse 93, temperature 99.3 F (37.4 C), temperature source Oral, resp. rate 16, height 5\' 2"  (1.575 m), weight 40.8 kg, SpO2 99 %, unknown if currently breastfeeding. BP 103/71   Pulse 93   Temp 99.3 F (37.4 C) (Oral)   Resp 16   Ht 5\' 2"  (1.575 m)   Wt 40.8 kg   SpO2 99%   BMI 16.46 kg/m  General appearance: alert, cooperative, appears stated age and no distress Head: Normocephalic, without obvious abnormality, atraumatic Neck: supple, symmetrical, trachea midline Lungs: normal effort Heart: regular rate and rhythm Abdomen: soft, Lower abdomainl pain bilaterally, + guarding, + rebound Extremities: Homans sign is negative, no sign of  DVT Skin: Skin color, texture, turgor normal. No rashes or lesions Neurologic: Grossly normal   Lab Results  Component Value Date   WBC 11.0 (H) 08/10/2019   HGB 13.6 08/10/2019   HCT 41.7 08/10/2019   MCV 96.8 08/10/2019   PLT 264 08/10/2019   Lab Results  Component Value Date   PREGTESTUR Negative 08/09/2015   HCG <5.0 08/10/2019     Assessment/Plan Principal Problem:   TOA (tubo-ovarian abscess)  For admission and IV Abx Cefoxitin and Doxy. If no significant improvement, may need IR drainage. Await CT/GC cultures.    10/07/2015 08/10/2019, 11:00 PM

## 2019-08-10 NOTE — ED Notes (Signed)
Patient had 1 episode of emesis.

## 2019-08-10 NOTE — ED Notes (Signed)
Patient requesting nausea medicine. PA aware

## 2019-08-10 NOTE — ED Triage Notes (Signed)
Pt brought to ED via EMS with c/o n/v that started around 9am. Pt reports vomiting yellow bile about 5x. Pt states she takes medication for HTN, did not take it today. Current BP 106/71.  A&Ox4.

## 2019-08-11 ENCOUNTER — Encounter (HOSPITAL_COMMUNITY): Payer: Self-pay

## 2019-08-11 LAB — GC/CHLAMYDIA PROBE AMP (~~LOC~~) NOT AT ARMC
Chlamydia: POSITIVE — AB
Neisseria Gonorrhea: NEGATIVE

## 2019-08-11 LAB — CREATININE, SERUM
Creatinine, Ser: 0.79 mg/dL (ref 0.44–1.00)
GFR calc Af Amer: >60 mL/min (ref >60)
GFR calc non Af Amer: >60 mL/min (ref >60)

## 2019-08-11 LAB — CBC
HCT: 35.1 % — ABNORMAL LOW (ref 36.0–46.0)
Hemoglobin: 11.6 g/dL — ABNORMAL LOW (ref 12.0–15.0)
MCH: 32.3 pg (ref 26.0–34.0)
MCHC: 33 g/dL (ref 30.0–36.0)
MCV: 97.8 fL (ref 80.0–100.0)
Platelets: 229 10*3/uL (ref 150–400)
RBC: 3.59 MIL/uL — ABNORMAL LOW (ref 3.87–5.11)
RDW: 12 % (ref 11.5–15.5)
WBC: 11.3 10*3/uL — ABNORMAL HIGH (ref 4.0–10.5)
nRBC: 0 % (ref 0.0–0.2)

## 2019-08-11 MED ORDER — AZITHROMYCIN 250 MG PO TABS
1000.0000 mg | ORAL_TABLET | Freq: Once | ORAL | Status: AC
  Administered 2019-08-11: 1000 mg via ORAL
  Filled 2019-08-11: qty 4

## 2019-08-11 NOTE — Progress Notes (Signed)
Subjective: Patient reports feeling slightly better since admission. She denies nausea or emesis. Patient reports good pain control    Objective: I have reviewed patient's vital signs, medications and labs.  Blood pressure 98/67, pulse 83, temperature 98.2 F (36.8 C), temperature source Oral, resp. rate 16, height 5\' 2"  (1.575 m), weight 40.6 kg, SpO2 99 %, unknown if currently breastfeeding. GENERAL: Well-developed, well-nourished female in no acute distress.  LUNGS: Clear to auscultation bilaterally.  HEART: Regular rate and rhythm. ABDOMEN: Soft, non distended, lower abdominal tenderness, no rebound, no guarding. No organomegaly. PELVIC: Not performed EXTREMITIES: No cyanosis, clubbing, or edema, 2+ distal pulses.   Assessment/Plan: 24 yo with bilateral TOA - Continue IV antibiotics - Pain management as needed - Follow up vaginal culture   LOS: 1 day    Victoria Turner 08/11/2019, 10:33 AM

## 2019-08-12 DIAGNOSIS — R079 Chest pain, unspecified: Secondary | ICD-10-CM

## 2019-08-12 LAB — TROPONIN I (HIGH SENSITIVITY): Troponin I (High Sensitivity): <2 ng/L (ref <18)

## 2019-08-12 MED ORDER — MORPHINE SULFATE (PF) 2 MG/ML IV SOLN
1.0000 mg | Freq: Once | INTRAVENOUS | Status: AC
  Administered 2019-08-13: 1 mg via INTRAVENOUS
  Filled 2019-08-12: qty 1

## 2019-08-12 NOTE — Consult Note (Signed)
Cardiology Consultation:   Patient ID: Victoria Turner MRN: [id number]; DOB: [birth year]  Admit date: 08/10/2019 Date of Consult: 08/12/2019  Primary Care Provider: Patient, No Pcp Per Primary Cardiologist: No primary care provider on file.  Primary Electrophysiologist:  None    Patient Profile:   Victoria Turner is a 24 y.o. female with a hx of pelvic inflammatory disease who is being seen today for the evaluation of chest pain at the request of Dr. Shawnie Pons.  History of Present Illness:   Victoria Turner was admitted to the hospital with nausea, vomiting and abdominal pain and found to have pelvic inflammatory disease with bilateral tubo-ovarian abscesses. She is in the hospital receiving IV antibiotics. She states that she developed sudden onset chest pain this evening when she received one of her medications. She describes the pain as a sharp stabbing sensation. It is worse when she lies flat. She feels that she may need to have a bowel movement to help relieve the pain. She has never had chest pain before and is active at home with her daughter. She has no history of heart problems. She denies any palpitations, shortness of breath, lower leg swelling, orthopnea, PND, lightheadedness or dizziness.  Heart Pathway Score:     Past Medical History:  Diagnosis Date  . Allergy    seasonal allergies  . GERD (gastroesophageal reflux disease)   . History of heavy periods   . HPV (human papilloma virus) infection   . Medical history non-contributory     Past Surgical History:  Procedure Laterality Date  . CESAREAN SECTION N/A 09/02/2018   Procedure: CESAREAN SECTION;  Surgeon: Willodean Rosenthal, MD;  Location: [facility];  Service: Obstetrics;  Laterality: N/A;  . NO PAST SURGERIES       Home Medications:  Prior to Admission medications   Medication Sig Start Date End Date Taking? Authorizing Provider  enalapril (VASOTEC) 5 MG tablet Take 5 mg by mouth  daily.   Yes [provider]  norethindrone (MICRONOR,CAMILA,ERRIN) 0.35 MG tablet Take 1 tablet (0.35 mg total) by mouth daily. Patient not taking: Reported on 08/10/2019 10/06/18   Conan Bowens, MD    Inpatient Medications: Scheduled Meds:  Continuous Infusions: . cefOXitin 2 g (08/12/19 1927)  . doxycycline (VIBRAMYCIN) IV 100 mg (08/12/19 2059)   PRN Meds: alum & mag hydroxide-simeth, guaiFENesin, ibuprofen, menthol-cetylpyridinium, promethazine, traMADol  Allergies:    Allergies  Allergen Reactions  . Pollen Extract     Social History:   Social History   Socioeconomic History  . Marital status: Single    Spouse name: Not on file  . Number of children: Not on file  . Years of education: Not on file  . Highest education level: Not on file  Occupational History  . Not on file  Tobacco Use  . Smoking status: Never Smoker  . Smokeless tobacco: Never Used  Substance and Sexual Activity  . Alcohol use: Never  . Drug use: Not Currently    Types: Marijuana  . Sexual activity: Not on file  Other Topics Concern  . Not on file  Social History Narrative   ** Merged History Encounter **       In foster (kinship) care with her biological paternal aunt and 3 of her 7 siblings   Social Determinants of Health   Financial Resource Strain:   . Difficulty of Paying Living Expenses: Not on file  Food Insecurity:   . Worried About Programme researcher, broadcasting/film/video in the Last Year:  Not on file  . Ran Out of Food in the Last Year: Not on file  Transportation Needs:   . Lack of Transportation (Medical): Not on file  . Lack of Transportation (Non-Medical): Not on file  Physical Activity:   . Days of Exercise per Week: Not on file  . Minutes of Exercise per Session: Not on file  Stress:   . Feeling of Stress : Not on file  Social Connections:   . Frequency of Communication with Friends and Family: Not on file  . Frequency of Social Gatherings with Friends and Family: Not on file    . Attends Religious Services: Not on file  . Active Member of Clubs or Organizations: Not on file  . Attends Banker Meetings: Not on file  . Marital Status: Not on file  Intimate Partner Violence:   . Fear of Current or Ex-Partner: Not on file  . Emotionally Abused: Not on file  . Physically Abused: Not on file  . Sexually Abused: Not on file    Family History:    Family History  Problem Relation Age of Onset  . Hypertension Mother   . Autism Sister   . Autism Brother   . Autism Sister   . Autism Sister   . ADD / ADHD Sister      ROS:  Please see the history of present illness.  All other ROS reviewed and negative.     Physical Exam/Data:   Vitals:   08/12/19 0007 08/12/19 0535 08/12/19 1216 08/12/19 1807  BP: 91/71 93/69 102/69 98/72  Pulse: 75 77 83 78  Resp: 16 16 18 18   Temp: (!) 97.4 F (36.3 C) 97.8 F (36.6 C) 98 F (36.7 C) 98.3 F (36.8 C)  TempSrc: Oral Oral Oral Oral  SpO2: 99% 98% 99% 99%  Weight:      Height:        Intake/Output Summary (Last 24 hours) at 08/12/2019 2213 Last data filed at 08/12/2019 2000 Gross per 24 hour  Intake 710 ml  Output --  Net 710 ml   Last 3 Weights 08/11/2019 08/10/2019 09/15/2018  Weight (lbs) 89 lb 8.1 oz 90 lb 106 lb 1.6 oz  Weight (kg) 40.6 kg 40.824 kg 48.127 kg     Body mass index is 16.37 kg/m.  General:  Well nourished, well developed, in no acute distress, sitting comfortably on the edge of the bed  HEENT: normal Lymph: no adenopathy Neck: no JVD Endocrine:  No thryomegaly Vascular: No carotid bruits; FA pulses 2+ bilaterally without bruits  Cardiac:  normal S1, S2; RRR; no murmurs Lungs:  clear to auscultation bilaterally, no wheezing, rhonchi or rales  Abd: soft  Ext: no edema Musculoskeletal:  No deformities, BUE and BLE strength normal and equal Skin: warm and dry  Neuro:  CNs 2-12 intact, no focal abnormalities noted Psych:  Normal affect   EKG:  The EKG was personally reviewed and  demonstrates: Normal sinus rhythm. Non-specific ST segment changes in V1 and V2 related to lead placement. No ischemic changes.   Relevant CV Studies: None  Laboratory Data:  High Sensitivity Troponin:  No results for input(s): TROPONINIHS in the last 720 hours.   Chemistry Recent Labs  Lab 08/10/19 1246 08/11/19 0248  NA 137  --   K 3.7  --   CL 103  --   CO2 26  --   GLUCOSE 82  --   BUN 9  --   CREATININE 0.69 0.79  CALCIUM 9.1  --   GFRNONAA >60 >60  GFRAA >60 >60  ANIONGAP 8  --     Recent Labs  Lab 08/10/19 1246  PROT 7.3  ALBUMIN 4.2  AST 15  ALT 7  ALKPHOS 60  BILITOT 0.9   Hematology Recent Labs  Lab 08/10/19 1246 08/11/19 0248  WBC 11.0* 11.3*  RBC 4.31 3.59*  HGB 13.6 11.6*  HCT 41.7 35.1*  MCV 96.8 97.8  MCH 31.6 32.3  MCHC 32.6 33.0  RDW 12.2 12.0  PLT 264 229   BNPNo results for input(s): BNP, PROBNP in the last 168 hours.  DDimer No results for input(s): DDIMER in the last 168 hours.   Radiology/Studies:  US Transvaginal Non-OB  Result Date: 08/10/2019 CLINICAL DATA:  Findings suspicious for pelvic inflammatory disease on recent CT examination. EXAM: TRANSABDOMINAL AND TRANSVAGINAL ULTRASOUND OF PELVIS DOPPLER ULTRASOUND OF OVARIES TECHNIQUE: Both transabdominal and transvaginal ultrasound examinations of the pelvis were performed. Transabdominal technique was performed for global imaging of the pelvis including uterus, ovaries, adnexal regions, and pelvic cul-de-sac. It was necessary to proceed with endovaginal exam following the transabdominal exam to visualize the ovaries. Color and duplex Doppler ultrasound was utilized to evaluate blood flow to the ovaries. COMPARISON:  CT from earlier in the same day. FINDINGS: Uterus Measurements: 8.3 x 3.7 x 5.2 cm. = volume: 83 mL. No fibroids or other mass visualized. Endometrium Thickness: 9 mm.  No focal abnormality visualized. Right ovary Measurements: 4.5 x 2.9 x 3.9 cm. = volume: 27 mL. Increased  vascularity is noted. The entire ovary is involved in a mixed echogenicity pattern which corresponds with that seen on prior CT examination. Left ovary Measurements: 7.8 x 4.6 x 5.0 cm. = volume: 92 mL. Complex cystic lesion is noted with internal echoes measuring 5.2 x 3.8 x 4.2 cm. This corresponds to the large fluid attenuation lesion on recent CT examination. Pulsed Doppler evaluation of both ovaries demonstrates normal low-resistance arterial and venous waveforms. Other findings Mild free fluid pelvic fluid is seen. IMPRESSION: Large complex cystic lesion is noted within the left ovary measuring 5.2 cm. This corresponds to that seen on the prior CT examination. This may represent a hemorrhagic cyst although the possibility of pelvic inflammatory disease deserves consideration. Complex appearing lesion within the right ovary similar to that seen on prior CT examination. The dilated tubular structure seen on the prior exam is not as well appreciated on this study and may be related to a focally fluid-filled loop of small bowel. The changes in the right adnexa again suggest pelvic inflammatory disease. Follow-up examination following completion of appropriate antibiotic therapy is recommended. Electronically Signed   By: Alcide Clever M.D.   On: 08/10/2019 20:12   US Pelvis Complete  Result Date: 08/10/2019 CLINICAL DATA:  Findings suspicious for pelvic inflammatory disease on recent CT examination. EXAM: TRANSABDOMINAL AND TRANSVAGINAL ULTRASOUND OF PELVIS DOPPLER ULTRASOUND OF OVARIES TECHNIQUE: Both transabdominal and transvaginal ultrasound examinations of the pelvis were performed. Transabdominal technique was performed for global imaging of the pelvis including uterus, ovaries, adnexal regions, and pelvic cul-de-sac. It was necessary to proceed with endovaginal exam following the transabdominal exam to visualize the ovaries. Color and duplex Doppler ultrasound was utilized to evaluate blood flow to the  ovaries. COMPARISON:  CT from earlier in the same day. FINDINGS: Uterus Measurements: 8.3 x 3.7 x 5.2 cm. = volume: 83 mL. No fibroids or other mass visualized. Endometrium Thickness: 9 mm.  No focal abnormality visualized. Right ovary  Measurements: 4.5 x 2.9 x 3.9 cm. = volume: 27 mL. Increased vascularity is noted. The entire ovary is involved in a mixed echogenicity pattern which corresponds with that seen on prior CT examination. Left ovary Measurements: 7.8 x 4.6 x 5.0 cm. = volume: 92 mL. Complex cystic lesion is noted with internal echoes measuring 5.2 x 3.8 x 4.2 cm. This corresponds to the large fluid attenuation lesion on recent CT examination. Pulsed Doppler evaluation of both ovaries demonstrates normal low-resistance arterial and venous waveforms. Other findings Mild free fluid pelvic fluid is seen. IMPRESSION: Large complex cystic lesion is noted within the left ovary measuring 5.2 cm. This corresponds to that seen on the prior CT examination. This may represent a hemorrhagic cyst although the possibility of pelvic inflammatory disease deserves consideration. Complex appearing lesion within the right ovary similar to that seen on prior CT examination. The dilated tubular structure seen on the prior exam is not as well appreciated on this study and may be related to a focally fluid-filled loop of small bowel. The changes in the right adnexa again suggest pelvic inflammatory disease. Follow-up examination following completion of appropriate antibiotic therapy is recommended. Electronically Signed   By: Inez Catalina M.D.   On: 08/10/2019 20:12   CT ABDOMEN PELVIS W CONTRAST  Result Date: 08/10/2019 CLINICAL DATA:  24 year old female with right lower quadrant abdominal pain. EXAM: CT ABDOMEN AND PELVIS WITH CONTRAST TECHNIQUE: Multidetector CT imaging of the abdomen and pelvis was performed using the standard protocol following bolus administration of intravenous contrast. CONTRAST:  78mL OMNIPAQUE  IOHEXOL 300 MG/ML  SOLN COMPARISON:  CT abdomen pelvis dated 10/15/2011. FINDINGS: Lower chest: The visualized lung bases are clear. No intra-abdominal free air. Probable small free fluid in the pelvis. Hepatobiliary: No focal liver abnormality is seen. No gallstones, gallbladder wall thickening, or biliary dilatation. Pancreas: Unremarkable. No pancreatic ductal dilatation or surrounding inflammatory changes. Spleen: Normal in size without focal abnormality. Adrenals/Urinary Tract: The adrenal glands, kidneys, visualized ureters appear unremarkable. The urinary bladder is collapsed. Stomach/Bowel: There is no bowel obstruction. Mildly thickened appearance of the small bowel loops, likely related to underdistention or reactive to inflammatory changes of the pelvis. The appendix is suboptimally visualized. A small pocket of air within a tubular appearing structure in the right lower quadrant (series 2, image 41 and coronal series 5, image 55) likely represents air within a normal appendix. Vascular/Lymphatic: The abdominal aorta and IVC are unremarkable. No portal venous gas. No adenopathy. Reproductive: The uterus is anteverted. There is a 5.8 x 4.1 cm cystic structure in the left adnexa concerning for an infected collection/abscess or infected ovarian cyst/follicle. Dilated fluid-filled tubular structures noted in the right adnexa. There is a cluster of multiloculated/multi cystic structure in the right adnexa measuring approximately 4.4 x 3.1 cm. Findings likely represent dilated fallopian tubes with inflammatory/infectious changes. Other: None Musculoskeletal: No acute or significant osseous findings. IMPRESSION: Constellation of findings concerning for pelvic inflammatory disease or tubo-ovarian abscess. Clinical correlation recommended. Pelvic ultrasound may provide better evaluation. Electronically Signed   By: Anner Crete M.D.   On: 08/10/2019 18:22   [place] Art/Ven Flow Abd Pelv Doppler  Result Date:  08/10/2019 CLINICAL DATA:  Findings suspicious for pelvic inflammatory disease on recent CT examination. EXAM: TRANSABDOMINAL AND TRANSVAGINAL ULTRASOUND OF PELVIS DOPPLER ULTRASOUND OF OVARIES TECHNIQUE: Both transabdominal and transvaginal ultrasound examinations of the pelvis were performed. Transabdominal technique was performed for global imaging of the pelvis including uterus, ovaries, adnexal regions, and pelvic cul-de-sac. It  was necessary to proceed with endovaginal exam following the transabdominal exam to visualize the ovaries. Color and duplex Doppler ultrasound was utilized to evaluate blood flow to the ovaries. COMPARISON:  CT from earlier in the same day. FINDINGS: Uterus Measurements: 8.3 x 3.7 x 5.2 cm. = volume: 83 mL. No fibroids or other mass visualized. Endometrium Thickness: 9 mm.  No focal abnormality visualized. Right ovary Measurements: 4.5 x 2.9 x 3.9 cm. = volume: 27 mL. Increased vascularity is noted. The entire ovary is involved in a mixed echogenicity pattern which corresponds with that seen on prior CT examination. Left ovary Measurements: 7.8 x 4.6 x 5.0 cm. = volume: 92 mL. Complex cystic lesion is noted with internal echoes measuring 5.2 x 3.8 x 4.2 cm. This corresponds to the large fluid attenuation lesion on recent CT examination. Pulsed Doppler evaluation of both ovaries demonstrates normal low-resistance arterial and venous waveforms. Other findings Mild free fluid pelvic fluid is seen. IMPRESSION: Large complex cystic lesion is noted within the left ovary measuring 5.2 cm. This corresponds to that seen on the prior CT examination. This may represent a hemorrhagic cyst although the possibility of pelvic inflammatory disease deserves consideration. Complex appearing lesion within the right ovary similar to that seen on prior CT examination. The dilated tubular structure seen on the prior exam is not as well appreciated on this study and may be related to a focally fluid-filled  loop of small bowel. The changes in the right adnexa again suggest pelvic inflammatory disease. Follow-up examination following completion of appropriate antibiotic therapy is recommended. Electronically Signed   By: Alcide Clever M.D.   On: 08/10/2019 20:12   { HEAR Score (for undifferentiated chest pain):     0  Assessment and Plan:   1. Chest pain- atypical chest pain with no risk factors for coronary disease and young age. Her ECG shows nonspecific changes in V1 and V2 related to lead placement and otherwise shows no evidence of ischemia. Her pain is likely non-cardiac in nature.   [facility] will sign off.   Medication Recommendations: None Other recommendations (labs, testing, etc):  None Follow up as an outpatient:  No  For questions or updates, please contact [facility] Please consult [email or web address] for contact info under     Signed, Nicoletta Ba, MD  08/12/2019 10:13 PM

## 2019-08-12 NOTE — Progress Notes (Signed)
  HD #2  Subjective: Patient reports tolerating PO, but still has discomfort with eating. She still complains of her pelvic pain. She quit using her OCPS about a month ago and says that she used a condom when she had sex last month. She says that she is not sure how she got chlamydia but says that she will notify her partner.    Objective: I have reviewed patient's vital signs, intake and output, medications, labs and microbiology.  General: alert Resp: clear to auscultation bilaterally Cardio: regular rate and rhythm, S1, S2 normal, no murmur, click, rub or gallop GI: Tender with palpation, mild rebound tenderness, NABS Extremities: extremities normal, atraumatic, no cyanosis or edema   Assessment/Plan: Her BPs are quite low, so I will discontinue her norvasc. She had eclampsia and I suspect this is when her BP med got started. She does not seem to need it now.  Bilateral TOA with + CT- she is now at about 48 hours of doxy and cefoxitin. She has also had a dose of zithromax. I have placed an order to consult with IR, made her NPO, and cancelled her lovenox.   LOS: 2 days    Victoria Turner 08/12/2019, 9:18 AM

## 2019-08-12 NOTE — Progress Notes (Signed)
IR requested by Dr. Marice Potter for possible image-guided TOA aspiration/drainage.  CT abdomen/pelvis 08/10/2019 reviewed today by Dr. Fredia Sorrow and Dr. Loreta Ave who state that abscesses are not amenable to percutaneous drainage (abscesses surrounded by loops of small bowel). No plans for IR procedure at this time. Will delete order. Dr. Marice Potter made aware.  IR available in future if needed.   Victoria Boga Jovonne Wilton, PA-C 08/12/2019, 1:24 PM

## 2019-08-13 ENCOUNTER — Encounter (HOSPITAL_COMMUNITY): Payer: Self-pay | Admitting: Family Medicine

## 2019-08-13 LAB — CBC WITH DIFFERENTIAL/PLATELET
Abs Immature Granulocytes: 0.02 10*3/uL (ref 0.00–0.07)
Basophils Absolute: 0 10*3/uL (ref 0.0–0.1)
Basophils Relative: 0 %
Eosinophils Absolute: 0.1 10*3/uL (ref 0.0–0.5)
Eosinophils Relative: 1 %
HCT: 34.3 % — ABNORMAL LOW (ref 36.0–46.0)
Hemoglobin: 11.6 g/dL — ABNORMAL LOW (ref 12.0–15.0)
Immature Granulocytes: 0 %
Lymphocytes Relative: 45 %
Lymphs Abs: 2.4 10*3/uL (ref 0.7–4.0)
MCH: 32 pg (ref 26.0–34.0)
MCHC: 33.8 g/dL (ref 30.0–36.0)
MCV: 94.8 fL (ref 80.0–100.0)
Monocytes Absolute: 0.3 10*3/uL (ref 0.1–1.0)
Monocytes Relative: 6 %
Neutro Abs: 2.5 10*3/uL (ref 1.7–7.7)
Neutrophils Relative %: 48 %
Platelets: 235 10*3/uL (ref 150–400)
RBC: 3.62 MIL/uL — ABNORMAL LOW (ref 3.87–5.11)
RDW: 12 % (ref 11.5–15.5)
WBC: 5.2 10*3/uL (ref 4.0–10.5)
nRBC: 0 % (ref 0.0–0.2)

## 2019-08-13 LAB — TYPE AND SCREEN
ABO/RH(D): A POS
Antibody Screen: NEGATIVE

## 2019-08-13 LAB — ABO/RH: ABO/RH(D): A POS

## 2019-08-13 MED ORDER — PANTOPRAZOLE SODIUM 40 MG IV SOLR
40.0000 mg | Freq: Two times a day (BID) | INTRAVENOUS | Status: DC
  Administered 2019-08-13 – 2019-08-15 (×5): 40 mg via INTRAVENOUS
  Filled 2019-08-13 (×5): qty 40

## 2019-08-13 MED ORDER — ENOXAPARIN SODIUM 40 MG/0.4ML ~~LOC~~ SOLN
40.0000 mg | SUBCUTANEOUS | Status: DC
  Filled 2019-08-13 (×2): qty 0.4

## 2019-08-13 MED ORDER — ENSURE ENLIVE PO LIQD
237.0000 mL | Freq: Two times a day (BID) | ORAL | Status: DC
  Administered 2019-08-13 – 2019-08-15 (×4): 237 mL via ORAL

## 2019-08-13 NOTE — Progress Notes (Signed)
Gynecology Progress Note  Admission Date: 08/10/2019 Current Date: 08/13/2019 1:08 PM  Victoria Turner is a 24 y.o. G1P0101 HD#4 admitted for left sided TOA   History complicated by: Patient Active Problem List   Diagnosis Date Noted  . TOA (tubo-ovarian abscess) 08/10/2019  . High risk sexual behavior 08/15/2015  . 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 Other seasonal allergic rhinitis 01/12/2014    ROS and patient/family/surgical history, located on admission H&P note dated 08/10/2019, have been reviewed, and there are no changes except as noted below Yesterday/Overnight Events:  Seen by IR and not anatomy not amenable to drainage  Subjective:  Pt feels stable to somewhat improved LLQ discomfort. Chest pain gone. No fevers, chills. Discomfort increases with PO but no nausea, vomiting.   Objective:    Current Vital Signs 24h Vital Sign Ranges  T 98.4 F (36.9 C) Temp  Avg: 98.1 F (36.7 C)  Min: 97.7 F (36.5 C)  Max: 98.4 F (36.9 C)  BP 98/66 BP  Min: 95/64  Max: 104/76  HR 83 Pulse  Avg: 72.8  Min: 62  Max: 83  RR 16 Resp  Avg: 17.5  Min: 16  Max: 18  SaO2 100 % Room Air SpO2  Avg: 99.8 %  Min: 99 %  Max: 100 %       24 Hour I/O Current Shift I/O  Time Ins Outs 02/05 0701 - 02/06 0700 In: 120 [P.O.:120] Out: -  02/06 0701 - 02/06 1900 In: 830 [P.O.:480] Out: -    Patient Vitals for the past 12 hrs:  BP Temp Temp src Pulse Resp SpO2 Weight  08/13/19 1149 98/66 98.4 F (36.9 C) Oral 83 16 100 % --  08/13/19 0817 95/64 -- -- 68 -- -- --  08/13/19 0539 101/75 97.9 F (36.6 C) Oral 62 18 100 % 41.4 kg     Patient Vitals for the past 24 hrs:  BP Temp Temp src Pulse Resp SpO2 Weight  08/13/19 1149 98/66 98.4 F (36.9 C) Oral 83 16 100 % --  08/13/19 0817 95/64 -- -- 68 -- -- --  08/13/19 0539 101/75 97.9 F (36.6 C) Oral 62 18 100 % 41.4 kg  08/13/19 0002 104/76 97.7 F (36.5 C) Oral 73 18 100 % --  08/12/19 1807 98/72 98.3 F (36.8 C)  Oral 78 18 99 % --    Physical exam: General appearance: alert, cooperative and appears stated age Abdomen: +BS, soft, mildly ttp in llq, no peritoneal s/s. GU: No gross VB Lungs: clear to auscultation bilaterally Heart: S1, S2 normal, no murmur, rub or gallop, regular rate and rhythm Extremities: no c/c/e Skin: warm and dry Psych: appropriate Neurologic: Grossly normal  Medications Current Facility-Administered Medications  Medication Dose Route Frequency Provider Last Rate Last Admin  . alum & mag hydroxide-simeth (MAALOX/MYLANTA) 200-200-20 MG/5ML suspension 30 mL  30 mL Oral Q4H PRN Donnamae Jude, MD      . cefOXitin (MEFOXIN) 2 g in sodium chloride 0.9 % 100 mL IVPB  2 g Intravenous Q6H Donnamae Jude, MD 200 mL/hr at 08/13/19 1129 2 g at 08/13/19 1129  . doxycycline (VIBRAMYCIN) 100 mg in sodium chloride 0.9 % 250 mL IVPB  100 mg Intravenous Q12H Donnamae Jude, MD 125 mL/hr at 08/13/19 0821 100 mg at 08/13/19 0821  . guaiFENesin (ROBITUSSIN) 100 MG/5ML solution 300 mg  15 mL Oral Q4H PRN Donnamae Jude, MD      . ibuprofen (ADVIL)  tablet 600 mg  600 mg Oral Q6H PRN Reva Bores, MD   600 mg at 08/12/19 1939  . menthol-cetylpyridinium (CEPACOL) lozenge 3 mg  1 lozenge Oral Q2H PRN Reva Bores, MD      . pantoprazole (PROTONIX) injection 40 mg  40 mg Intravenous Q12H Sparacino, Hailey L, DO   40 mg at 08/13/19 6384  . promethazine (PHENERGAN) injection 12.5 mg  12.5 mg Intravenous Q6H PRN Reva Bores, MD   12.5 mg at 08/12/19 1943  . traMADol (ULTRAM) tablet 50 mg  50 mg Oral Q6H PRN Reva Bores, MD   50 mg at 08/13/19 0153      Labs  Recent Labs  Lab 08/10/19 1246 08/11/19 0248 08/13/19 1053  WBC 11.0* 11.3* 5.2  HGB 13.6 11.6* 11.6*  HCT 41.7 35.1* 34.3*  PLT 264 229 235    Recent Labs  Lab 08/10/19 1246 08/11/19 0248  NA 137  --   K 3.7  --   CL 103  --   CO2 26  --   BUN 9  --   CREATININE 0.69 0.79  CALCIUM 9.1  --   PROT 7.3  --   BILITOT  0.9  --   ALKPHOS 60  --   ALT 7  --   AST 15  --   GLUCOSE 82  --     Radiology No new imaging  Assessment & Plan:  Pt imporving *GYN: WBC improved and symptomatically pt feeling a little better. Expectations d/w her and that pain should be improving and won't be gone completely when okay for discharge to home. Continue on iv abx for today and consider transition tomorrow to PO meds *Pain: PO PRNs *FEN/GI: will add on ensure drinks *PPx: lovenox added *Dispo: potentially monday  Code Status: Full Code  Total time taking care of the patient was 20 minutes, with greater than 50% of the time spent in face to face interaction with the patient.  Cornelia Copa MD Attending Center for [facility] GYN Consult Phone: [phone] (M-F, 0800-1700) & [phone] (Off hours, weekends, holidays)

## 2019-08-14 DIAGNOSIS — N7003 Acute salpingitis and oophoritis: Secondary | ICD-10-CM

## 2019-08-14 MED ORDER — METRONIDAZOLE 500 MG PO TABS: 500.0000 mg | ORAL_TABLET | Freq: Two times a day (BID) | ORAL | Status: DC

## 2019-08-14 MED ORDER — DOXYCYCLINE HYCLATE 100 MG PO TABS: 100.0000 mg | ORAL_TABLET | Freq: Two times a day (BID) | ORAL | Status: DC

## 2019-08-14 MED ORDER — DOXYCYCLINE HYCLATE 100 MG PO TABS
100.0000 mg | ORAL_TABLET | Freq: Two times a day (BID) | ORAL | Status: DC
  Administered 2019-08-14 – 2019-08-15 (×2): 100 mg via ORAL
  Filled 2019-08-14 (×2): qty 1

## 2019-08-14 MED ORDER — METRONIDAZOLE 500 MG PO TABS
500.0000 mg | ORAL_TABLET | Freq: Two times a day (BID) | ORAL | Status: DC
  Administered 2019-08-14 – 2019-08-15 (×3): 500 mg via ORAL
  Filled 2019-08-14 (×4): qty 1

## 2019-08-14 NOTE — Progress Notes (Signed)
Pt reports 7/10 lower abdominal pain when using the bathroom  Pt states it resolves when resting Pt states it is new   Pt laying in bed comfortably at this time  Informed MD Alysia Penna  MD aware  Will continue to monitor

## 2019-08-14 NOTE — Progress Notes (Signed)
HD # 2 TOA  Subjective: Patient reports feeling better. Tolerating diet but decreased. Some abd cramps at times. Ambulating and voiding without problems.  Objective: AF VSS  Lungs clear Heart RRR Abd soft + BS mild ttp LLQ no rebound Ext non tender   Assessment/Plan: TOA  WBC normal. Afebrile. Will switch to oral antibiotics. Hopefully home tomorrow.  LOS: 4 days    Hermina Staggers 08/14/2019, 10:40 AM

## 2019-08-15 DIAGNOSIS — A749 Chlamydial infection, unspecified: Secondary | ICD-10-CM | POA: Diagnosis present

## 2019-08-15 MED ORDER — IBUPROFEN 600 MG PO TABS: 600.0000 mg | ORAL_TABLET | Freq: Four times a day (QID) | ORAL | 2 refills | Status: DC | PRN

## 2019-08-15 MED ORDER — DOXYCYCLINE HYCLATE 100 MG PO TABS: 100.0000 mg | ORAL_TABLET | Freq: Two times a day (BID) | ORAL | 0 refills | Status: AC

## 2019-08-15 MED ORDER — TRAMADOL HCL 50 MG PO TABS: 50.0000 mg | ORAL_TABLET | Freq: Four times a day (QID) | ORAL | 0 refills | Status: DC | PRN

## 2019-08-15 MED ORDER — METRONIDAZOLE 500 MG PO TABS: 500.0000 mg | ORAL_TABLET | Freq: Two times a day (BID) | ORAL | 0 refills | Status: AC

## 2019-08-15 NOTE — Progress Notes (Signed)
Pt alert and oriented x4. Pt verbalized she wants to remove saline lock line. Pt educated.Pt also made aware some of her meds are IV. Pt states she did not need IV meds anymore and refused scheduled IV Protonix. Pt stable. No acute sign of distress noted. Pt rested well throughout the night.

## 2019-08-15 NOTE — Progress Notes (Signed)
Patient has been discharged from facility. She had to be called back to campus after leaving without discharge paperwork, or peripheral IV discontinue. Upon arrival to unit patient showed Clinical research associate where she had taken the peripheral IV out on her own. She had been offered a band aid but did not accept. Discharge paperwork has been provided. Patient states her ride was outside that's why she left facility without notifying anyone. Patient has been educated that she needs to wait for her discharge instructions, and allow her bedside nurse to remove any I.V.'s prior to leaving facility. Patient states that she understands and urgently left the unit.

## 2019-08-15 NOTE — Discharge Summary (Signed)
Physician Discharge Summary  Patient ID: Victoria Turner MRN: [id number] DOB/AGE: 1995/10/08 24 y.o.  Admit date: 08/10/2019 Discharge date: 08/15/2019  Admission and Discharge Diagnoses:  Principal Problem:   TOA (tubo-ovarian abscess) Active Problems:   High risk sexual behavior   Chlamydia infection  Treatments: Antibiotics: azithromycin 1000 mg po x 1, IV Cefoxitin, Metronidazole and Doxycycline for 4 days; then Oral Metronidazole and Doxycyline for 2 days while hospitalized  Hospital Course:  Patient was admitted with bilateral TOA, also found to have active chlamydia infection. She was treated with the aforementioned antibiotics, and her course improved while on IV antibiotics.  After four days of IV antibiotics, she was switched to oral antibiotics which she tolerated well.  Pain was improved, there were no further concerning symptoms.  Patient was educated about her STI, recommended testing for other STIs, also needs to let partner(s) know so the partner(s) can get testing and treatment. Patient and sex partner(s) should abstain from unprotected sexual activity for two weeks after everyone receives appropriate treatment.   Patient will return to office in one week for follow up.  She was discharged to home in stable condition.   Discharge Exam: Blood pressure 109/86, pulse 72, temperature 97.8 F (36.6 C), temperature source Oral, resp. rate 16, height 5\' 2"  (1.575 m), weight 39.3 kg, SpO2 97 %, unknown if currently breastfeeding. GENERAL: Well-developed, well-nourished female in no acute distress.  LUNGS: Clear to auscultation bilaterally.  HEART: Regular rate and rhythm. ABDOMEN: Soft, non distended, mild left lower abdominal tenderness, no rebound, no guarding. No organomegaly. PELVIC: Not performed EXTREMITIES: No cyanosis, clubbing, or edema, 2+ distal pulses  Discharged Condition: Stable   Significant Diagnostic Studies:  Results for orders placed or performed  during the hospital encounter of 08/10/19 (from the past 168 hour(s))  GC/Chlamydia probe amp   Collection Time: 08/10/19 12:00 AM  Result Value Ref Range   Chlamydia **POSITIVE** (A)    Neisseria Gonorrhea Negative   Comprehensive metabolic panel   Collection Time: 08/10/19 12:46 PM  Result Value Ref Range   Sodium 137 135 - 145 mmol/L   Potassium 3.7 3.5 - 5.1 mmol/L   Chloride 103 98 - 111 mmol/L   CO2 26 22 - 32 mmol/L   Glucose, Bld 82 70 - 99 mg/dL   BUN 9 6 - 20 mg/dL   Creatinine, Ser 10/08/19 0.44 - 1.00 mg/dL   Calcium 9.1 8.9 - 0.93 mg/dL   Total Protein 7.3 6.5 - 8.1 g/dL   Albumin 4.2 3.5 - 5.0 g/dL   AST 15 15 - 41 U/L   ALT 7 0 - 44 U/L   Alkaline Phosphatase 60 38 - 126 U/L   Total Bilirubin 0.9 0.3 - 1.2 mg/dL   GFR calc non Af Amer >60 >60 mL/min   GFR calc Af Amer >60 >60 mL/min   Anion gap 8 5 - 15  Lipase, blood   Collection Time: 08/10/19 12:46 PM  Result Value Ref Range   Lipase 18 11 - 51 U/L  CBC with Differential   Collection Time: 08/10/19 12:46 PM  Result Value Ref Range   WBC 11.0 (H) 4.0 - 10.5 K/uL   RBC 4.31 3.87 - 5.11 MIL/uL   Hemoglobin 13.6 12.0 - 15.0 g/dL   HCT 10/08/19 57.3 - 22.0 %   MCV 96.8 80.0 - 100.0 fL   MCH 31.6 26.0 - 34.0 pg   MCHC 32.6 30.0 - 36.0 g/dL   RDW 25.4 27.0 - 62.3 %  Platelets 264 150 - 400 K/uL   nRBC 0.0 0.0 - 0.2 %   Neutrophils Relative % 87 %   Neutro Abs 9.5 (H) 1.7 - 7.7 K/uL   Lymphocytes Relative 10 %   Lymphs Abs 1.1 0.7 - 4.0 K/uL   Monocytes Relative 3 %   Monocytes Absolute 0.4 0.1 - 1.0 K/uL   Eosinophils Relative 0 %   Eosinophils Absolute 0.0 0.0 - 0.5 K/uL   Basophils Relative 0 %   Basophils Absolute 0.0 0.0 - 0.1 K/uL   Immature Granulocytes 0 %   Abs Immature Granulocytes 0.04 0.00 - 0.07 K/uL  I-Stat beta hCG blood, ED   Collection Time: 08/10/19  1:05 PM  Result Value Ref Range   I-stat hCG, quantitative <5.0 <5 mIU/mL   Comment 3          Wet prep, genital   Collection Time:  08/10/19  3:25 PM   Specimen: PATH Cytology Cervicovaginal Ancillary Only  Result Value Ref Range   Yeast Wet Prep HPF POC NONE SEEN NONE SEEN   Trich, Wet Prep NONE SEEN NONE SEEN   Clue Cells Wet Prep HPF POC NONE SEEN NONE SEEN   WBC, Wet Prep HPF POC MANY (A) NONE SEEN   Sperm NONE SEEN   Urinalysis, Routine w reflex microscopic   Collection Time: 08/10/19  8:00 PM  Result Value Ref Range   Color, Urine AMBER (A) YELLOW   APPearance HAZY (A) CLEAR   Specific Gravity, Urine >1.046 (H) 1.005 - 1.030   pH 6.0 5.0 - 8.0   Glucose, UA NEGATIVE NEGATIVE mg/dL   Hgb urine dipstick LARGE (A) NEGATIVE   Bilirubin Urine NEGATIVE NEGATIVE   Ketones, ur 20 (A) NEGATIVE mg/dL   Protein, ur 30 (A) NEGATIVE mg/dL   Nitrite NEGATIVE NEGATIVE   Leukocytes,Ua MODERATE (A) NEGATIVE   RBC / HPF >50 (H) 0 - 5 RBC/hpf   WBC, UA >50 (H) 0 - 5 WBC/hpf   Bacteria, UA FEW (A) NONE SEEN   Squamous Epithelial / LPF 0-5 0 - 5   Mucus PRESENT   Respiratory Panel by RT PCR (Flu A&B, Covid) - Nasopharyngeal Swab   Collection Time: 08/10/19  8:27 PM   Specimen: Nasopharyngeal Swab  Result Value Ref Range   SARS Coronavirus 2 by RT PCR NEGATIVE NEGATIVE   Influenza A by PCR NEGATIVE NEGATIVE   Influenza B by PCR NEGATIVE NEGATIVE  CBC   Collection Time: 08/11/19  2:48 AM  Result Value Ref Range   WBC 11.3 (H) 4.0 - 10.5 K/uL   RBC 3.59 (L) 3.87 - 5.11 MIL/uL   Hemoglobin 11.6 (L) 12.0 - 15.0 g/dL   HCT 16.135.1 (L) 09.636.0 - 04.546.0 %   MCV 97.8 80.0 - 100.0 fL   MCH 32.3 26.0 - 34.0 pg   MCHC 33.0 30.0 - 36.0 g/dL   RDW 40.912.0 81.111.5 - 91.415.5 %   Platelets 229 150 - 400 K/uL   nRBC 0.0 0.0 - 0.2 %  Creatinine, serum   Collection Time: 08/11/19  2:48 AM  Result Value Ref Range   Creatinine, Ser 0.79 0.44 - 1.00 mg/dL   GFR calc non Af Amer >60 >60 mL/min   GFR calc Af Amer >60 >60 mL/min  Troponin I (High Sensitivity)   Collection Time: 08/12/19  9:55 PM  Result Value Ref Range   Troponin I (High  Sensitivity) <2 <18 ng/L  CBC with Differential/Platelet   Collection Time: 08/13/19 10:53 AM  Result Value Ref Range   WBC 5.2 4.0 - 10.5 K/uL   RBC 3.62 (L) 3.87 - 5.11 MIL/uL   Hemoglobin 11.6 (L) 12.0 - 15.0 g/dL   HCT 37.6 (L) 28.3 - 15.1 %   MCV 94.8 80.0 - 100.0 fL   MCH 32.0 26.0 - 34.0 pg   MCHC 33.8 30.0 - 36.0 g/dL   RDW 76.1 60.7 - 37.1 %   Platelets 235 150 - 400 K/uL   nRBC 0.0 0.0 - 0.2 %   Neutrophils Relative % 48 %   Neutro Abs 2.5 1.7 - 7.7 K/uL   Lymphocytes Relative 45 %   Lymphs Abs 2.4 0.7 - 4.0 K/uL   Monocytes Relative 6 %   Monocytes Absolute 0.3 0.1 - 1.0 K/uL   Eosinophils Relative 1 %   Eosinophils Absolute 0.1 0.0 - 0.5 K/uL   Basophils Relative 0 %   Basophils Absolute 0.0 0.0 - 0.1 K/uL   Immature Granulocytes 0 %   Abs Immature Granulocytes 0.02 0.00 - 0.07 K/uL  Type and screen [facility]   Collection Time: 08/13/19 10:53 AM  Result Value Ref Range   ABO/RH(D) A POS    Antibody Screen NEG    Sample Expiration      08/16/2019,2359 Performed at [facility], [address], [place], [place] [address]   ABO/Rh   Collection Time: 08/13/19 10:53 AM  Result Value Ref Range   ABO/RH(D)      A POS Performed at [facility], [address] [place], [place] [address]    Imaging: US Transvaginal Non-OB  Result Date: 08/10/2019 CLINICAL DATA:  Findings suspicious for pelvic inflammatory disease on recent CT examination. EXAM: TRANSABDOMINAL AND TRANSVAGINAL ULTRASOUND OF PELVIS DOPPLER ULTRASOUND OF OVARIES TECHNIQUE: Both transabdominal and transvaginal ultrasound examinations of the pelvis were performed. Transabdominal technique was performed for global imaging of the pelvis including uterus, ovaries, adnexal regions, and pelvic cul-de-sac. It was necessary to proceed with endovaginal exam following the transabdominal exam to visualize the ovaries. Color and duplex Doppler ultrasound was utilized to evaluate blood  flow to the ovaries. COMPARISON:  CT from earlier in the same day. FINDINGS: Uterus Measurements: 8.3 x 3.7 x 5.2 cm. = volume: 83 mL. No fibroids or other mass visualized. Endometrium Thickness: 9 mm.  No focal abnormality visualized. Right ovary Measurements: 4.5 x 2.9 x 3.9 cm. = volume: 27 mL. Increased vascularity is noted. The entire ovary is involved in a mixed echogenicity pattern which corresponds with that seen on prior CT examination. Left ovary Measurements: 7.8 x 4.6 x 5.0 cm. = volume: 92 mL. Complex cystic lesion is noted with internal echoes measuring 5.2 x 3.8 x 4.2 cm. This corresponds to the large fluid attenuation lesion on recent CT examination. Pulsed Doppler evaluation of both ovaries demonstrates normal low-resistance arterial and venous waveforms. Other findings Mild free fluid pelvic fluid is seen. IMPRESSION: Large complex cystic lesion is noted within the left ovary measuring 5.2 cm. This corresponds to that seen on the prior CT examination. This may represent a hemorrhagic cyst although the possibility of pelvic inflammatory disease deserves consideration. Complex appearing lesion within the right ovary similar to that seen on prior CT examination. The dilated tubular structure seen on the prior exam is not as well appreciated on this study and may be related to a focally fluid-filled loop of small bowel. The changes in the right adnexa again suggest pelvic inflammatory disease. Follow-up examination following  completion of appropriate antibiotic therapy is recommended. Electronically Signed   By: Inez Catalina M.D.   On: 08/10/2019 20:12   US Pelvis Complete  Result Date: 08/10/2019 CLINICAL DATA:  Findings suspicious for pelvic inflammatory disease on recent CT examination. EXAM: TRANSABDOMINAL AND TRANSVAGINAL ULTRASOUND OF PELVIS DOPPLER ULTRASOUND OF OVARIES TECHNIQUE: Both transabdominal and transvaginal ultrasound examinations of the pelvis were performed. Transabdominal  technique was performed for global imaging of the pelvis including uterus, ovaries, adnexal regions, and pelvic cul-de-sac. It was necessary to proceed with endovaginal exam following the transabdominal exam to visualize the ovaries. Color and duplex Doppler ultrasound was utilized to evaluate blood flow to the ovaries. COMPARISON:  CT from earlier in the same day. FINDINGS: Uterus Measurements: 8.3 x 3.7 x 5.2 cm. = volume: 83 mL. No fibroids or other mass visualized. Endometrium Thickness: 9 mm.  No focal abnormality visualized. Right ovary Measurements: 4.5 x 2.9 x 3.9 cm. = volume: 27 mL. Increased vascularity is noted. The entire ovary is involved in a mixed echogenicity pattern which corresponds with that seen on prior CT examination. Left ovary Measurements: 7.8 x 4.6 x 5.0 cm. = volume: 92 mL. Complex cystic lesion is noted with internal echoes measuring 5.2 x 3.8 x 4.2 cm. This corresponds to the large fluid attenuation lesion on recent CT examination. Pulsed Doppler evaluation of both ovaries demonstrates normal low-resistance arterial and venous waveforms. Other findings Mild free fluid pelvic fluid is seen. IMPRESSION: Large complex cystic lesion is noted within the left ovary measuring 5.2 cm. This corresponds to that seen on the prior CT examination. This may represent a hemorrhagic cyst although the possibility of pelvic inflammatory disease deserves consideration. Complex appearing lesion within the right ovary similar to that seen on prior CT examination. The dilated tubular structure seen on the prior exam is not as well appreciated on this study and may be related to a focally fluid-filled loop of small bowel. The changes in the right adnexa again suggest pelvic inflammatory disease. Follow-up examination following completion of appropriate antibiotic therapy is recommended. Electronically Signed   By: Inez Catalina M.D.   On: 08/10/2019 20:12   CT ABDOMEN PELVIS W CONTRAST  Result Date:  08/10/2019 CLINICAL DATA:  24 year old female with right lower quadrant abdominal pain. EXAM: CT ABDOMEN AND PELVIS WITH CONTRAST TECHNIQUE: Multidetector CT imaging of the abdomen and pelvis was performed using the standard protocol following bolus administration of intravenous contrast. CONTRAST:  40mL OMNIPAQUE IOHEXOL 300 MG/ML  SOLN COMPARISON:  CT abdomen pelvis dated 10/15/2011. FINDINGS: Lower chest: The visualized lung bases are clear. No intra-abdominal free air. Probable small free fluid in the pelvis. Hepatobiliary: No focal liver abnormality is seen. No gallstones, gallbladder wall thickening, or biliary dilatation. Pancreas: Unremarkable. No pancreatic ductal dilatation or surrounding inflammatory changes. Spleen: Normal in size without focal abnormality. Adrenals/Urinary Tract: The adrenal glands, kidneys, visualized ureters appear unremarkable. The urinary bladder is collapsed. Stomach/Bowel: There is no bowel obstruction. Mildly thickened appearance of the small bowel loops, likely related to underdistention or reactive to inflammatory changes of the pelvis. The appendix is suboptimally visualized. A small pocket of air within a tubular appearing structure in the right lower quadrant (series 2, image 41 and coronal series 5, image 55) likely represents air within a normal appendix. Vascular/Lymphatic: The abdominal aorta and IVC are unremarkable. No portal venous gas. No adenopathy. Reproductive: The uterus is anteverted. There is a 5.8 x 4.1 cm cystic structure in the left adnexa concerning for an  infected collection/abscess or infected ovarian cyst/follicle. Dilated fluid-filled tubular structures noted in the right adnexa. There is a cluster of multiloculated/multi cystic structure in the right adnexa measuring approximately 4.4 x 3.1 cm. Findings likely represent dilated fallopian tubes with inflammatory/infectious changes. Other: None Musculoskeletal: No acute or significant osseous findings.  IMPRESSION: Constellation of findings concerning for pelvic inflammatory disease or tubo-ovarian abscess. Clinical correlation recommended. Pelvic ultrasound may provide better evaluation. Electronically Signed   By: Elgie Collard M.D.   On: 08/10/2019 18:22   [place] Art/Ven Flow Abd Pelv Doppler  Result Date: 08/10/2019 CLINICAL DATA:  Findings suspicious for pelvic inflammatory disease on recent CT examination. EXAM: TRANSABDOMINAL AND TRANSVAGINAL ULTRASOUND OF PELVIS DOPPLER ULTRASOUND OF OVARIES TECHNIQUE: Both transabdominal and transvaginal ultrasound examinations of the pelvis were performed. Transabdominal technique was performed for global imaging of the pelvis including uterus, ovaries, adnexal regions, and pelvic cul-de-sac. It was necessary to proceed with endovaginal exam following the transabdominal exam to visualize the ovaries. Color and duplex Doppler ultrasound was utilized to evaluate blood flow to the ovaries. COMPARISON:  CT from earlier in the same day. FINDINGS: Uterus Measurements: 8.3 x 3.7 x 5.2 cm. = volume: 83 mL. No fibroids or other mass visualized. Endometrium Thickness: 9 mm.  No focal abnormality visualized. Right ovary Measurements: 4.5 x 2.9 x 3.9 cm. = volume: 27 mL. Increased vascularity is noted. The entire ovary is involved in a mixed echogenicity pattern which corresponds with that seen on prior CT examination. Left ovary Measurements: 7.8 x 4.6 x 5.0 cm. = volume: 92 mL. Complex cystic lesion is noted with internal echoes measuring 5.2 x 3.8 x 4.2 cm. This corresponds to the large fluid attenuation lesion on recent CT examination. Pulsed Doppler evaluation of both ovaries demonstrates normal low-resistance arterial and venous waveforms. Other findings Mild free fluid pelvic fluid is seen. IMPRESSION: Large complex cystic lesion is noted within the left ovary measuring 5.2 cm. This corresponds to that seen on the prior CT examination. This may represent a hemorrhagic cyst  although the possibility of pelvic inflammatory disease deserves consideration. Complex appearing lesion within the right ovary similar to that seen on prior CT examination. The dilated tubular structure seen on the prior exam is not as well appreciated on this study and may be related to a focally fluid-filled loop of small bowel. The changes in the right adnexa again suggest pelvic inflammatory disease. Follow-up examination following completion of appropriate antibiotic therapy is recommended. Electronically Signed   By: Alcide Clever M.D.   On: 08/10/2019 20:12    Discharge disposition: 01-Home or Self Care       Allergies as of 08/15/2019      Reactions   Pollen Extract       Medication List    TAKE these medications   doxycycline 100 MG tablet Commonly known as: VIBRA-TABS Take 1 tablet (100 mg total) by mouth 2 (two) times daily for 14 days.   enalapril 5 MG tablet Commonly known as: VASOTEC Take 5 mg by mouth daily.   ibuprofen 600 MG tablet Commonly known as: ADVIL Take 1 tablet (600 mg total) by mouth every 6 (six) hours as needed for mild pain, moderate pain or cramping.   metroNIDAZOLE 500 MG tablet Commonly known as: FLAGYL Take 1 tablet (500 mg total) by mouth 2 (two) times daily for 14 days.   norethindrone 0.35 MG tablet Commonly known as: MICRONOR Take 1 tablet (0.35 mg total) by mouth daily.   traMADol 50  MG tablet Commonly known as: ULTRAM Take 1 tablet (50 mg total) by mouth every 6 (six) hours as needed for moderate pain.        Signed: Jaynie Collins, MD 08/15/2019, 2:16 PM

## 2019-08-15 NOTE — Discharge Instructions (Signed)
Pelvic Inflammatory Disease  Pelvic inflammatory disease (PID) is caused by an infection in some or all of the female reproductive organs. The infection can be in the uterus, ovaries, fallopian tubes, or the surrounding tissues in the pelvis. PID can cause abdominal or pelvic pain that comes on suddenly (acute pelvic pain). PID is a serious infection because it can lead to lasting (chronic) pelvic pain or the inability to have children (infertility). What are the causes? This condition is most often caused by bacteria that is spread during sexual contact. It can also be caused by a bacterial infection of the vagina (bacterial vaginosis) that is not spread by sexual contact. This condition occurs when the infection is not treated and the bacteria travel upward from the vagina or cervix into the reproductive organs. Bacteria may also be introduced into the reproductive organs following:  The birth of a baby.  A miscarriage.  An abortion.  Major pelvic surgery.  The insertion of an intrauterine device (IUD).  A sexual assault. What increases the risk? You are more likely to develop this condition if you:  Are younger than 23 years of age.  Are sexually active at a young age.  Have a history of STI (sexually transmitted infection) or PID.  Do not regularly use barrier contraception methods, such as condoms.  Have multiple sexual partners.  Have sex with someone who has symptoms of an STI.  Use a vaginal douche.  Have recently had an IUD inserted. What are the signs or symptoms? Symptoms of this condition include:  Abdominal or pelvic pain.  Fever.  Chills.  Abnormal vaginal discharge.  Abnormal uterine bleeding.  Unusual pain shortly after the end of a menstrual period.  Painful urination.  Pain with sex.  Nausea and vomiting. How is this diagnosed? This condition is diagnosed based on a pelvic exam and medical history. A pelvic exam can reveal signs of  infection, inflammation, and discharge in the vagina and the surrounding tissues. It can also help to identify painful areas. You may also have tests, such as:  Lab tests, including a pregnancy test, blood tests, and a urine test.  Culture tests of the vagina and cervix to check for an STI.  Ultrasound.  A laparoscopic procedure to look inside the pelvis.  Examination of vaginal discharge under a microscope. How is this treated? This condition may be treated with:  Antibiotic medicines taken by mouth (orally). For more severe cases, antibiotics may be given through an IV at the hospital.  Surgery. This is rare. Surgery may be needed if other treatments do not help.  Efforts to stop the spread of the infection. Sexual partners may need to be treated if the infection is caused by an STI. It may take weeks until you are completely well. If you are diagnosed with PID, you should also be checked for HIV (human immunodeficiency virus). Your health care provider may test you for infection again 3 months after treatment. You should not have unprotected sex. Follow these instructions at home:  Take over-the-counter and prescription medicines only as told by your health care provider.  If you were prescribed an antibiotic medicine, take it as told by your health care provider. Do not stop using the antibiotic even if you start to feel better.  Do not have sex until treatment is completed or as told by your health care provider. If PID is confirmed, your recent sexual partners will need treatment, especially if you had unprotected sex.  Keep all   follow-up visits as told by your health care provider. This is important. Contact a health care provider if:  You have increased or abnormal vaginal discharge.  Your pain does not improve.  You vomit.  You have a fever.  You cannot tolerate your medicines.  Your partner has an STI.  You have pain when you urinate. Get help right away if:   You have increased abdominal or pelvic pain.  You have chills.  Your symptoms are not better in 72 hours with treatment. Summary  Pelvic inflammatory disease (PID) is caused by an infection in some or all of the female reproductive organs.  PID is a serious infection because it can lead to lasting (chronic) pelvic pain or the inability to have children (infertility).  This infection is usually treated with antibiotic medicines.  Do not have sex until treatment is completed or as told by your health care provider. This information is not intended to replace advice given to you by your health care provider. Make sure you discuss any questions you have with your health care provider. Document Revised: 03/11/2018 Document Reviewed: 03/16/2018 Elsevier Patient Education  2020 Elsevier Inc.  

## 2019-08-24 ENCOUNTER — Ambulatory Visit: Payer: Medicaid Other | Admitting: Obstetrics & Gynecology

## 2019-08-26 ENCOUNTER — Ambulatory Visit: Payer: Medicaid Other | Admitting: Obstetrics & Gynecology

## 2019-08-30 ENCOUNTER — Encounter: Payer: Self-pay | Admitting: *Deleted

## 2019-08-30 NOTE — Congregational Nurse Program (Signed)
  Dept: [phone]   Congregational Nurse Program Note  Date of Encounter: 08/30/2019  Patient desires referral to PCP so that she can receive care on a regular basis.  Patient also requests help with transportation to appointment on 09/21/19 for daughter  aged 23, DOB 09/02/2018.  Patient has been prescribed Enalapril 5 mg tab daily.  She reports she had preeclampsia and a seizure during childbirth in 2020 and has been taking this medication since that time.  She reports that she only takes it when "she feels like she needs it".  Encouraged patient to take medication daily as prescribed. Patient also prescribed Micronor 0.35 mg tab for birth control after birth of child on 09/02/18.  She states she took the birth control for 1 1/2 weeks, then discontinued using the birth control.  She states that when she is sexually active she uses condoms.  Past Medical History: Past Medical History:  Diagnosis Date  . Allergy    seasonal allergies  . Eclampsia 09/02/2018  . GERD (gastroesophageal reflux disease)   . History of heavy periods   . HPV (human papilloma virus) infection   . Ileus ([facility]) 09/04/2018  . Severe preeclampsia, third trimester 09/02/2018    Encounter Details: CNP Questionnaire - 08/30/19 1609      Questionnaire   Race  Black or African American    Location Patient Served At  [employer]    Uninsured  Not Applicable    Food  No food insecurities    Housing/Utilities  No permanent housing    Transportation  Yes, need transportation assistance    Interpersonal Safety  Yes, feel physically and emotionally safe where you currently live    Medication  No medication insecurities    Medical Provider  No    Referrals  Primary Care Provider/Clinic    ED Visit Averted  Not Applicable    Life-Saving Intervention Made  Not Applicable

## 2019-08-31 ENCOUNTER — Other Ambulatory Visit: Payer: Self-pay

## 2019-08-31 DIAGNOSIS — Z20822 Contact with and (suspected) exposure to covid-19: Secondary | ICD-10-CM

## 2019-09-01 LAB — NOVEL CORONAVIRUS, NAA: SARS-CoV-2, NAA: NOT DETECTED

## 2019-09-14 ENCOUNTER — Encounter: Payer: Self-pay | Admitting: Obstetrics & Gynecology

## 2019-09-14 ENCOUNTER — Other Ambulatory Visit: Payer: Self-pay

## 2019-09-14 ENCOUNTER — Other Ambulatory Visit (HOSPITAL_COMMUNITY)
Admission: RE | Admit: 2019-09-14 | Discharge: 2019-09-14 | Disposition: A | Discharge status: Home / Self Care | Payer: Medicaid Other | Source: Ambulatory Visit | Attending: Obstetrics & Gynecology | Admitting: Obstetrics & Gynecology

## 2019-09-14 ENCOUNTER — Ambulatory Visit (INDEPENDENT_AMBULATORY_CARE_PROVIDER_SITE_OTHER): Payer: Medicaid Other | Admitting: Obstetrics & Gynecology

## 2019-09-14 VITALS — BP 98/72 | HR 91 | Wt 89.7 lb

## 2019-09-14 DIAGNOSIS — Z7185 Encounter for immunization safety counseling: Secondary | ICD-10-CM

## 2019-09-14 DIAGNOSIS — R399 Unspecified symptoms and signs involving the genitourinary system: Secondary | ICD-10-CM

## 2019-09-14 DIAGNOSIS — B373 Candidiasis of vulva and vagina: Secondary | ICD-10-CM

## 2019-09-14 DIAGNOSIS — Z7189 Other specified counseling: Secondary | ICD-10-CM | POA: Diagnosis not present

## 2019-09-14 DIAGNOSIS — N7093 Salpingitis and oophoritis, unspecified: Secondary | ICD-10-CM | POA: Diagnosis present

## 2019-09-14 DIAGNOSIS — A749 Chlamydial infection, unspecified: Secondary | ICD-10-CM | POA: Diagnosis not present

## 2019-09-14 DIAGNOSIS — Z23 Encounter for immunization: Secondary | ICD-10-CM

## 2019-09-14 DIAGNOSIS — Z01419 Encounter for gynecological examination (general) (routine) without abnormal findings: Secondary | ICD-10-CM | POA: Diagnosis present

## 2019-09-14 DIAGNOSIS — Z124 Encounter for screening for malignant neoplasm of cervix: Secondary | ICD-10-CM

## 2019-09-14 DIAGNOSIS — Z3042 Encounter for surveillance of injectable contraceptive: Secondary | ICD-10-CM

## 2019-09-14 DIAGNOSIS — B3731 Acute candidiasis of vulva and vagina: Secondary | ICD-10-CM

## 2019-09-14 LAB — POCT PREGNANCY, URINE: Preg Test, Ur: NEGATIVE

## 2019-09-14 MED ORDER — SULFAMETHOXAZOLE-TRIMETHOPRIM 800-160 MG PO TABS: 1.0000 | ORAL_TABLET | Freq: Two times a day (BID) | ORAL | 0 refills | Status: AC

## 2019-09-14 MED ORDER — NAPROXEN 500 MG PO TABS: 500.0000 mg | ORAL_TABLET | Freq: Two times a day (BID) | ORAL | 2 refills | Status: DC

## 2019-09-14 MED ORDER — MEDROXYPROGESTERONE ACETATE 150 MG/ML IM SUSP
150.0000 mg | INTRAMUSCULAR | Status: DC
  Administered 2019-09-14: 16:00:00 150 mg via INTRAMUSCULAR

## 2019-09-14 NOTE — Patient Instructions (Addendum)
.HPV (Human Papillomavirus) Vaccine: What You Need to Know 1. Why get vaccinated? HPV (Human papillomavirus) vaccine can prevent infection with some types of human papillomavirus. HPV infections can cause certain types of cancers including:  cervical, vaginal and vulvar cancers in women,  penile cancer in men, and  anal cancers in both men and women. HPV vaccine prevents infection from the HPV types that cause over 90% of these cancers. HPV is spread through intimate skin-to-skin or sexual contact. HPV infections are so common that nearly all men and women will get at least one type of HPV at some time in their lives. Most HPV infections go away by themselves within 2 years. But sometimes HPV infections will last longer and can cause cancers later in life. 2. HPV vaccine HPV vaccine is routinely recommended for adolescents at 23 or 23 years of age to ensure they are protected before they are exposed to the virus. HPV vaccine may be given beginning at age 23 years, and as late as age 23 years. Most people older than 26 years will not benefit from HPV vaccination. Talk with your health care provider if you want more information. Most children who get the first dose before 23 years of age need 2 doses of HPV vaccine. Anyone who gets the first dose on or after 23 years of age, and younger people with certain immunocompromising conditions, need 3 doses. Your health care provider can give you more information. HPV vaccine may be given at the same time as other vaccines. 3. Talk with your health care provider Tell your vaccine provider if the person getting the vaccine:  Has had an allergic reaction after a previous dose of HPV vaccine, or has any severe, life-threatening allergies.  Is pregnant. In some cases, your health care provider may decide to postpone HPV vaccination to a future visit. People with minor illnesses, such as a cold, may be vaccinated. People who are moderately or severely ill  should usually wait until they recover before getting HPV vaccine. Your health care provider can give you more information. 4. Risks of a vaccine reaction  Soreness, redness, or swelling where the shot is given can happen after HPV vaccine.  Fever or headache can happen after HPV vaccine. People sometimes faint after medical procedures, including vaccination. Tell your provider if you feel dizzy or have vision changes or ringing in the ears. As with any medicine, there is a very remote chance of a vaccine causing a severe allergic reaction, other serious injury, or death. 5. What if there is a serious problem? An allergic reaction could occur after the vaccinated person leaves the clinic. If you see signs of a severe allergic reaction (hives, swelling of the face and throat, difficulty breathing, a fast heartbeat, dizziness, or weakness), call 9-1-1 and get the person to the nearest hospital. For other signs that concern you, call your health care provider. Adverse reactions should be reported to the Vaccine Adverse Event Reporting System (VAERS). Your health care provider will usually file this report, or you can do it yourself. Visit the VAERS website at www.vaers.SamedayNews.es or call 978-479-2372. VAERS is only for reporting reactions, and VAERS staff do not give medical advice. 6. The National Vaccine Injury Compensation Program The [employer] Vaccine Injury Compensation Program (VICP) is a federal program that was created to compensate people who may have been injured by certain vaccines. Visit the VICP website at GoldCloset.com.ee or call 502-803-1365 to learn about the program and about filing a claim. There  is a time limit to file a claim for compensation. 7. How can I learn more?  Ask your health care provider.  Call your local or state health department.  Contact the Centers for Disease Control and Prevention (CDC): ? Call 814-770-8304 (1-800-CDC-INFO) or ? Visit CDC's  website at http://hunter.com/ Vaccine Information Statement HPV Vaccine (05/05/2018) This information is not intended to replace advice given to you by your health care provider. Make sure you discuss any questions you have with your health care provider. Document Revised: 10/12/2018 Document Reviewed: 02/02/2018 Elsevier Patient Education  2020 [place] 87-23 Years Old, Female Preventive care refers to visits with your health care provider and lifestyle choices that can promote health and wellness. This includes:  A yearly physical exam. This may also be called an annual well check.  Regular dental visits and eye exams.  Immunizations.  Screening for certain conditions.  Healthy lifestyle choices, such as eating a healthy diet, getting regular exercise, not using drugs or products that contain nicotine and tobacco, and limiting alcohol use. What can I expect for my preventive care visit? Physical exam Your health care provider will check your:  Height and weight. This may be used to calculate body mass index (BMI), which tells if you are at a healthy weight.  Heart rate and blood pressure.  Skin for abnormal spots. Counseling Your health care provider may ask you questions about your:  Alcohol, tobacco, and drug use.  Emotional well-being.  Home and relationship well-being.  Sexual activity.  Eating habits.  Work and work Statistician.  Method of birth control.  Menstrual cycle.  Pregnancy history. What immunizations do I need?  Influenza (flu) vaccine  This is recommended every year. Tetanus, diphtheria, and pertussis (Tdap) vaccine  You may need a Td booster every 10 years. Varicella (chickenpox) vaccine  You may need this if you have not been vaccinated. Human papillomavirus (HPV) vaccine  If recommended by your health care provider, you may need three doses over 6 months. Measles, mumps, and rubella (MMR) vaccine  You may  need at least one dose of MMR. You may also need a second dose. Meningococcal conjugate (MenACWY) vaccine  One dose is recommended if you are age 23-23 years and a first-year college student living in a residence hall, or if you have one of several medical conditions. You may also need additional booster doses. Pneumococcal conjugate (PCV13) vaccine  You may need this if you have certain conditions and were not previously vaccinated. Pneumococcal polysaccharide (PPSV23) vaccine  You may need one or two doses if you smoke cigarettes or if you have certain conditions. Hepatitis A vaccine  You may need this if you have certain conditions or if you travel or work in places where you may be exposed to hepatitis A. Hepatitis B vaccine  You may need this if you have certain conditions or if you travel or work in places where you may be exposed to hepatitis B. Haemophilus influenzae type b (Hib) vaccine  You may need this if you have certain conditions. You may receive vaccines as individual doses or as more than one vaccine together in one shot (combination vaccines). Talk with your health care provider about the risks and benefits of combination vaccines. What tests do I need?  Blood tests  Lipid and cholesterol levels. These may be checked every 5 years starting at age 36.  Hepatitis C test.  Hepatitis B test. Screening  Diabetes screening. This is done by  checking your blood sugar (glucose) after you have not eaten for a while (fasting).  Sexually transmitted disease (STD) testing.  BRCA-related cancer screening. This may be done if you have a family history of breast, ovarian, tubal, or peritoneal cancers.  Pelvic exam and Pap test. This may be done every 3 years starting at age 26. Starting at age 63, this may be done every 5 years if you have a Pap test in combination with an HPV test. Talk with your health care provider about your test results, treatment options, and if  necessary, the need for more tests. Follow these instructions at home: Eating and drinking   Eat a diet that includes fresh fruits and vegetables, whole grains, lean protein, and low-fat dairy.  Take vitamin and mineral supplements as recommended by your health care provider.  Do not drink alcohol if: ? Your health care provider tells you not to drink. ? You are pregnant, may be pregnant, or are planning to become pregnant.  If you drink alcohol: ? Limit how much you have to 0-1 drink a day. ? Be aware of how much alcohol is in your drink. In the [place] one drink equals one 12 oz bottle of beer (355 mL), one 5 oz glass of wine (148 mL), or one 1 oz glass of hard liquor (44 mL). Lifestyle  Take daily care of your teeth and gums.  Stay active. Exercise for at least 30 minutes on 5 or more days each week.  Do not use any products that contain nicotine or tobacco, such as cigarettes, e-cigarettes, and chewing tobacco. If you need help quitting, ask your health care provider.  If you are sexually active, practice safe sex. Use a condom or other form of birth control (contraception) in order to prevent pregnancy and STIs (sexually transmitted infections). If you plan to become pregnant, see your health care provider for a preconception visit. What's next?  Visit your health care provider once a year for a well check visit.  Ask your health care provider how often you should have your eyes and teeth checked.  Stay up to date on all vaccines. This information is not intended to replace advice given to you by your health care provider. Make sure you discuss any questions you have with your health care provider. Document Revised: 03/04/2018 Document Reviewed: 03/04/2018 Elsevier Patient Education  2020 [employer].

## 2019-09-14 NOTE — Progress Notes (Signed)
GYNECOLOGY OFFICE VISIT NOTE  History:   Victoria Turner is a 24 y.o. G1P0101 here today for follow up after recent admission on 08/10/19 for left TOA and chlamydia infection.  She reports she completed her antibiotics as prescribed. Still reports left sided pain which Ibuprofen does not help very much. Also lower abdominal pain after she urinates, feels she has a UTI.  Has not been sexually active since her admission. Desires Depo Provera for contraception.  She denies any abnormal vaginal discharge, bleeding or other concerns.    Past Medical History:  Diagnosis Date  . Allergy    seasonal allergies  . Eclampsia 09/02/2018  . GERD (gastroesophageal reflux disease)   . History of heavy periods   . HPV (human papilloma virus) infection   . Ileus ([place]) 09/04/2018  . Severe preeclampsia, third trimester 09/02/2018    Past Surgical History:  Procedure Laterality Date  . CESAREAN SECTION N/A 09/02/2018   Procedure: CESAREAN SECTION;  Surgeon: Lavonia Drafts, MD;  Location: [facility];  Service: Obstetrics;  Laterality: N/A;    The following portions of the patient's history were reviewed and updated as appropriate: allergies, current medications, past family history, past medical history, past social history, past surgical history and problem list.   Health Maintenance:  Does not think she has gotten HPV vaccine series. No pap smear yet.   Review of Systems:  Pertinent items noted in HPI and remainder of comprehensive ROS otherwise negative.  Physical Exam:  BP 98/72   Pulse 91   Wt 89 lb 11.2 oz (40.7 kg)   LMP 08/10/2019   Breastfeeding No   BMI 16.41 kg/m  CONSTITUTIONAL: Well-developed, well-nourished female in no acute distress.  HEENT:  Normocephalic, atraumatic. External right and left ear normal. No scleral icterus.  NECK: Normal range of motion, supple, no masses noted on observation SKIN: No rash noted. Not diaphoretic. No erythema. No  pallor. MUSCULOSKELETAL: Normal range of motion. No edema noted. NEUROLOGIC: Alert and oriented to person, place, and time. Normal muscle tone coordination. No cranial nerve deficit noted. PSYCHIATRIC: Normal mood and affect. Normal behavior. Normal judgment and thought content. CARDIOVASCULAR: Normal heart rate noted RESPIRATORY: Effort and breath sounds normal, no problems with respiration noted ABDOMEN: Soft, LLQ tenderness and lower abdominal tenderness to palpation, no rebound or guarding. No masses noted. No other overt distention noted.   PELVIC: Normal appearing external genitalia; normal appearing vaginal mucosa and cervix.  Scant yellow discharge noted, testing sample obtained.  Pap smear done. Normal uterine size, no other palpable masses, mild-moderate uterine and left adnexal tenderness.     Assessment and Plan:      1. TOA (tubo-ovarian abscess) 2. Chlamydia infection Still having pain, Naproxen recommended and prescribed. TOC done today, will follow up results and manage accordingly. If pain is persistent, may need reimaging. Emphasized safe sex practices.  - Cervicovaginal ancillary only( [place]) - naproxen (NAPROSYN) 500 MG tablet; Take 1 tablet (500 mg total) by mouth 2 (two) times daily with a meal. As needed for pain  Dispense: 60 tablet; Refill: 2   3. UTI symptoms Presumptively treating for UTI, will follow up culture.  - sulfamethoxazole-trimethoprim (BACTRIM DS) 800-160 MG tablet; Take 1 tablet by mouth 2 (two) times daily for 3 days.  Dispense: 6 tablet; Refill: 0 - Urine Culture  4. Need for Tdap vaccination - Tdap vaccine greater than or equal to 7yo IM  5. Papanicolaou smear for cervical cancer screening - Cytology - PAP( [facility]) done, will follow up results and manage accordingly.  6. Depo-Provera contraceptive status Has been on this in the past, UPT neg today.  Started today. Return every 3 months. - medroxyPROGESTERone (DEPO-PROVERA) injection  150 mg  7. HPV vaccine counseling Counseled regarding HPV vaccine, she will consider this. Information given to her to review at home.   Routine preventative health maintenance measures emphasized. Please refer to After Visit Summary for other counseling recommendations.   Return in about 3 months (around 12/15/2019) for any gynecologic concerns.    Total face-to-face time with patient: 25 minutes.  Over 50% of encounter was spent on counseling and coordination of care.   Jaynie Collins, MD, FACOG Obstetrician & Gynecologist, [facility] for [employer], [facility]

## 2019-09-15 LAB — CERVICOVAGINAL ANCILLARY ONLY
Bacterial Vaginitis (gardnerella): NEGATIVE
Candida Glabrata: NEGATIVE
Candida Vaginitis: POSITIVE — AB
Chlamydia: NEGATIVE
Comment: NEGATIVE
Comment: NEGATIVE
Comment: NEGATIVE
Comment: NEGATIVE
Comment: NEGATIVE
Comment: NORMAL
Neisseria Gonorrhea: NEGATIVE
Trichomonas: NEGATIVE

## 2019-09-15 LAB — CYTOLOGY - PAP: Diagnosis: NEGATIVE

## 2019-09-15 LAB — URINE CULTURE

## 2019-09-15 MED ORDER — FLUCONAZOLE 150 MG PO TABS: 150.0000 mg | ORAL_TABLET | ORAL | 3 refills | Status: DC

## 2019-09-15 NOTE — Addendum Note (Signed)
Addended by: Jaynie Collins A on: 09/15/2019 01:14 PM   Modules accepted: Orders

## 2019-11-01 ENCOUNTER — Encounter: Payer: Self-pay | Admitting: *Deleted

## 2019-11-08 ENCOUNTER — Encounter: Payer: Self-pay | Admitting: *Deleted

## 2019-11-08 NOTE — Congregational Nurse Program (Signed)
  Dept: [phone]   Congregational Nurse Program Note  Date of Encounter: 11/01/2019  Patient experiencing symptoms of coronavirus.  Patient left with daughter to stay in hotel for quarantine.  Daughter has tested positive for Covid.  Patient will receive testing tomorrow.  Past Medical History: Past Medical History:  Diagnosis Date  . Allergy    seasonal allergies  . Eclampsia 09/02/2018  . GERD (gastroesophageal reflux disease)   . History of heavy periods   . HPV (human papilloma virus) infection   . Ileus ([facility]) 09/04/2018  . Severe preeclampsia, third trimester 09/02/2018    Encounter Details:

## 2019-11-15 ENCOUNTER — Other Ambulatory Visit: Payer: Self-pay

## 2019-11-15 ENCOUNTER — Ambulatory Visit (INDEPENDENT_AMBULATORY_CARE_PROVIDER_SITE_OTHER): Payer: Medicaid Other | Admitting: *Deleted

## 2019-11-15 ENCOUNTER — Encounter: Payer: Self-pay | Admitting: *Deleted

## 2019-11-15 ENCOUNTER — Other Ambulatory Visit (HOSPITAL_COMMUNITY)
Admission: RE | Admit: 2019-11-15 | Discharge: 2019-11-15 | Disposition: A | Discharge status: Home / Self Care | Payer: Medicaid Other | Source: Ambulatory Visit | Attending: Family Medicine | Admitting: Family Medicine

## 2019-11-15 DIAGNOSIS — Z202 Contact with and (suspected) exposure to infections with a predominantly sexual mode of transmission: Secondary | ICD-10-CM

## 2019-11-15 DIAGNOSIS — R300 Dysuria: Secondary | ICD-10-CM | POA: Diagnosis present

## 2019-11-15 LAB — POCT URINALYSIS DIP (DEVICE)
Bilirubin Urine: NEGATIVE
Glucose, UA: NEGATIVE mg/dL
Ketones, ur: NEGATIVE mg/dL
Nitrite: NEGATIVE
Protein, ur: 30 mg/dL — AB
Specific Gravity, Urine: >=1.03 (ref 1.005–1.030)
Urobilinogen, UA: 0.2 mg/dL (ref 0.0–1.0)
pH: 6 (ref 5.0–8.0)

## 2019-11-15 NOTE — Congregational Nurse Program (Signed)
  Dept: [phone]   Congregational Nurse Program Note  Date of Encounter: 11/15/2019  Patient returned to [facility] [place] from quarantine for COVID-19 on 11/12/19.  I spoke with her today and she reports that she experienced mild allergy type symptoms with the virus, but otherwise did not feel badly.  She reports that her toddler daughter had severe respiratory symptoms related to COVID-19 and she had to transport her to the ED.  Paitient returned to the shelter on 11/12/2019.    Past Medical History: Past Medical History:  Diagnosis Date  . Allergy    seasonal allergies  . Eclampsia 09/02/2018  . GERD (gastroesophageal reflux disease)   . History of heavy periods   . HPV (human papilloma virus) infection   . Ileus ([facility]) 09/04/2018  . Severe preeclampsia, third trimester 09/02/2018    Encounter Details: CNP Questionnaire - 11/15/19 1739      Questionnaire   Patient Status  Not Applicable    Race  Black or African American    Location Patient Served At  [employer]    Uninsured  Not Applicable    Food  No food insecurities    Housing/Utilities  No permanent housing    Transportation  Yes, need transportation assistance    Interpersonal Safety  Yes, feel physically and emotionally safe where you currently live    Medication  Yes, have medication insecurities    Referrals  Area Agency    ED Visit Averted  Not Applicable    Life-Saving Intervention Made  Not Applicable

## 2019-11-15 NOTE — Congregational Nurse Program (Signed)
  Dept: [phone]   Congregational Nurse Program Note  Date of Encounter: 11/08/2019  Patient tested positive for SARS-COV-2 on report form from [place] [facility] [place], [address].  Reported positive on 11/03/2019.  Patient went to hotel to quarantine on 11/01/19 with her toddler daughter who had tested positive for Covid. She will return to shelter after 10 days per CDC COVID-19 guidelines.  Past Medical History: Past Medical History:  Diagnosis Date  . Allergy    seasonal allergies  . Eclampsia 09/02/2018  . GERD (gastroesophageal reflux disease)   . History of heavy periods   . HPV (human papilloma virus) infection   . Ileus (HCC) 09/04/2018  . Severe preeclampsia, third trimester 09/02/2018    Encounter Details: CNP Questionnaire - 11/15/19 1724      Questionnaire   Patient Status  Not Applicable    Race  Native Hawaiian or Other Pacific Islander    Location Patient Served At  [employer]    Uninsured  Not Applicable    Food  No food insecurities    Housing/Utilities  No permanent housing    Transportation  Yes, need transportation assistance    Interpersonal Safety  Yes, feel physically and emotionally safe where you currently live    Medication  No medication insecurities    Medical Provider  No    Referrals  Area Agency    ED Visit Averted  Not Applicable    Life-Saving Intervention Made  Not Applicable

## 2019-11-15 NOTE — Addendum Note (Signed)
Addended by: Gerome Apley on: 11/15/2019 10:49 AM   Modules accepted: Orders

## 2019-11-15 NOTE — Progress Notes (Addendum)
States wants to get checked for gonorrhea because her partner informed her he recently tested for gonorrhea. States last time they were intimate was beginning of April. States has burning after she urinates.  Discussed doing wet prep but has period and is bleeding heavy. Discussed with Chrissie Noa, NP and will do urinalyis ( not clean catch) for UA and for GC .  Informed patient we will contact her if positive.she voices understanding. Erasto Sleight,RN   Chart reviewed for nurse visit. Agree with plan of care.   Currie Paris, NP 11/15/2019 11:17 AM

## 2019-11-16 ENCOUNTER — Telehealth: Payer: Self-pay | Admitting: *Deleted

## 2019-11-16 ENCOUNTER — Telehealth: Payer: Self-pay

## 2019-11-16 LAB — CERVICOVAGINAL ANCILLARY ONLY
Chlamydia: NEGATIVE
Comment: NEGATIVE
Comment: NORMAL
Neisseria Gonorrhea: POSITIVE — AB

## 2019-11-16 LAB — URINE CULTURE

## 2019-11-16 NOTE — Telephone Encounter (Signed)
-----   Message from Currie Paris, NP sent at 11/16/2019  1:06 PM EDT ----- Gonorrhea noted.  Please contact client for treatment.  Chlamydia is negative so only needs Rocephin 500 mg IM.  Notify the health department and no intercourse for 2 weeks.

## 2019-11-16 NOTE — Telephone Encounter (Signed)
I left a vm for patient to callback as it looks like result where put in under a different name and we need to verify information to make sure it correct. It under [place] same date of birth.

## 2019-11-16 NOTE — Telephone Encounter (Signed)
I called [place] to notify of + Gonorrhea and left a message I was calling with results and to make appointment. Please call our office.  Will leave in in box to call her once more and if we do not reach her or she does not call back ; we can send letter and MyChart message. Completed std report for health department Beuna Bolding,RN

## 2019-11-16 NOTE — Telephone Encounter (Signed)
Pt. Called back. Verified DOB and address. Pt. Is under another name - [place]. Has a separate chart with that name.

## 2019-11-16 NOTE — Telephone Encounter (Signed)
Patient calling about covid result that she did 2 days ago. I don't see anything in the system. Will have to contact [facility] and follow back up with patient

## 2019-11-17 NOTE — Telephone Encounter (Signed)
Called pt and left message on her personal VM stating that this is a second attempt to deliver test result information. Since pt's outgoing message stated her name, I left the information of +Gonorrhea and need for treatment in our office. Pt was asked to call and schedule appointment. She was also instructed to abstain from intercourse until 2 weeks after her treatment. Pt was instructed that her partner will require treatment as well and can schedule appt @ [facility]. Pt may call back if she has questions.

## 2019-11-18 ENCOUNTER — Telehealth (HOSPITAL_COMMUNITY): Payer: Self-pay

## 2019-11-18 ENCOUNTER — Ambulatory Visit (HOSPITAL_COMMUNITY)
Admission: EM | Admit: 2019-11-18 | Discharge: 2019-11-18 | Disposition: A | Discharge status: Home / Self Care | Payer: Medicaid Other | Attending: Family Medicine | Admitting: Family Medicine

## 2019-11-18 ENCOUNTER — Ambulatory Visit: Payer: Medicaid Other

## 2019-11-18 ENCOUNTER — Other Ambulatory Visit: Payer: Self-pay

## 2019-11-18 ENCOUNTER — Encounter (HOSPITAL_COMMUNITY): Payer: Self-pay

## 2019-11-18 DIAGNOSIS — J069 Acute upper respiratory infection, unspecified: Secondary | ICD-10-CM | POA: Diagnosis not present

## 2019-11-18 MED ORDER — CLARITIN-D 12 HOUR 5-120 MG PO TB12: 1.0000 | ORAL_TABLET | Freq: Two times a day (BID) | ORAL | 0 refills | Status: DC

## 2019-11-18 MED ORDER — IBUPROFEN 600 MG PO TABS: 600.0000 mg | ORAL_TABLET | Freq: Four times a day (QID) | ORAL | 2 refills | Status: DC | PRN

## 2019-11-18 NOTE — ED Triage Notes (Signed)
Pt presents with nasal congestion and sore throat; pt states herself and her daughter had a positive covid exposure a few days ago where they live.  Pt and her daughter were covid positive October 26, 2019

## 2019-11-18 NOTE — ED Provider Notes (Addendum)
[facility]    CSN: [id number] Arrival date & time: 11/18/19  1022      History   Chief Complaint Chief Complaint  Patient presents with  . Nasal Congestion  . Sore Throat    HPI Victoria Turner is a 24 y.o. female.   HPI Pleasant 24 year old woman who is here with upper respiratory symptoms.  Sore throat.  Congestion.  Right ear pain.  Is been present for the last 2 days. She is concerned because she had Covid exposure where she lives at a shelter with her daughter She did have a Covid infection diagnosed on 10/26/2019 I discussed with her that she should be immune to Covid and this is likely a viral URI No fever chills, no headache, no body aches Past Medical History:  Diagnosis Date  . Allergy    seasonal allergies  . Eclampsia 09/02/2018  . GERD (gastroesophageal reflux disease)   . History of heavy periods   . HPV (human papilloma virus) infection   . Ileus (HCC) 09/04/2018  . Severe preeclampsia, third trimester 09/02/2018    Patient Active Problem List   Diagnosis Date Noted  . Chlamydia infection 08/15/2019  . TOA (tubo-ovarian abscess) 08/10/2019  . High risk sexual behavior 08/15/2015  . 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 Other seasonal allergic rhinitis 01/12/2014    Past Surgical History:  Procedure Laterality Date  . CESAREAN SECTION N/A 09/02/2018   Procedure: CESAREAN SECTION;  Surgeon: Willodean Rosenthal, MD;  Location: [facility];  Service: Obstetrics;  Laterality: N/A;    OB History    Gravida  1   Para  1   Term  0   Preterm  1   AB  0   Living  1     SAB  0   TAB  0   Ectopic  0   Multiple      Live Births  1            Home Medications    Prior to Admission medications   Medication Sig Start Date End Date Taking? Authorizing Provider  enalapril (VASOTEC) 5 MG tablet Take 5 mg by mouth daily.    [provider]  ibuprofen (ADVIL) 600 MG tablet Take 1 tablet (600 mg  total) by mouth every 6 (six) hours as needed for mild pain, moderate pain or cramping. 11/18/19   Eustace Moore, MD  loratadine-pseudoephedrine (CLARITIN-D 12 HOUR) 5-120 MG tablet Take 1 tablet by mouth 2 (two) times daily. 11/18/19   Eustace Moore, MD  naproxen (NAPROSYN) 500 MG tablet Take 1 tablet (500 mg total) by mouth 2 (two) times daily with a meal. As needed for pain 09/14/19   Anyanwu, Jethro Bastos, MD  traMADol (ULTRAM) 50 MG tablet Take 1 tablet (50 mg total) by mouth every 6 (six) hours as needed for moderate pain. 08/15/19   Anyanwu, Jethro Bastos, MD  norethindrone (MICRONOR,CAMILA,ERRIN) 0.35 MG tablet Take 1 tablet (0.35 mg total) by mouth daily. Patient not taking: Reported on 08/10/2019 10/06/18 11/18/19  Conan Bowens, MD    Family History Family History  Problem Relation Age of Onset  . Hypertension Mother   . Autism Sister   . Autism Brother   . Autism Sister   . Autism Sister   . ADD / ADHD Sister     Social History Social History   Tobacco Use  . Smoking status: Current Some Day Smoker  . Smokeless tobacco: Never  Used  . Tobacco comment: black and mild 4x week  Substance Use Topics  . Alcohol use: Never  . Drug use: Not Currently    Types: Marijuana     Allergies   Pollen extract   Review of Systems Review of Systems  Constitutional: Negative for chills and fever.  HENT: Positive for congestion, ear pain and sore throat.   Respiratory: Negative for cough and shortness of breath.   Gastrointestinal: Negative for nausea and vomiting.  Neurological: Negative for headaches.     Physical Exam Triage Vital Signs ED Triage Vitals  Enc Vitals Group     BP 11/18/19 1111 108/67     Pulse Rate 11/18/19 1111 (!) 110     Resp 11/18/19 1111 17     Temp 11/18/19 1111 99 F (37.2 C)     Temp Source 11/18/19 1111 Oral     SpO2 11/18/19 1111 95 %     Weight --      Height --      Head Circumference --      Peak Flow --      Pain Score 11/18/19 1112 4      Pain Loc --      Pain Edu? --      Excl. in GC? --    No data found.  Updated Vital Signs BP 108/67 (BP Location: Right Arm)   Pulse (!) 110   Temp 99 F (37.2 C) (Oral)   Resp 17   SpO2 95%      Physical Exam Constitutional:      General: She is not in acute distress.    Appearance: She is well-developed and normal weight.     Comments: Appears tired  HENT:     Head: Normocephalic and atraumatic.     Right Ear: Tympanic membrane and ear canal normal.     Left Ear: Tympanic membrane and ear canal normal.     Nose: No congestion or rhinorrhea.     Mouth/Throat:     Mouth: Mucous membranes are moist.     Pharynx: Posterior oropharyngeal erythema present.     Tonsils: 0 on the right. 0 on the left.     Comments: Posterior pharynx mildly injected.  No exudate Eyes:     Conjunctiva/sclera: Conjunctivae normal.     Pupils: Pupils are equal, round, and reactive to light.  Neck:     Comments: Small mobile anterior cervical nodes bilaterally Cardiovascular:     Rate and Rhythm: Normal rate and regular rhythm.  Pulmonary:     Effort: Pulmonary effort is normal. No respiratory distress.     Breath sounds: Normal breath sounds.  Musculoskeletal:        General: Normal range of motion.     Cervical back: Normal range of motion.  Lymphadenopathy:     Cervical: Cervical adenopathy present.  Skin:    General: Skin is warm and dry.  Neurological:     Mental Status: She is alert.  Psychiatric:        Mood and Affect: Mood normal.        Behavior: Behavior normal.      UC Treatments / Results  Labs (all labs ordered are listed, but only abnormal results are displayed) Labs Reviewed - No data to display  EKG   Radiology No results found.  Procedures Procedures (including critical care time)  Medications Ordered in UC Medications - No data to display  Initial Impression / Assessment and Plan /  UC Course  I have reviewed the triage vital signs and the nursing  notes.  Pertinent labs & imaging results that were available during my care of the patient were reviewed by me and considered in my medical decision making (see chart for details).     I reviewed the patient's chart after she left, and unfortunately identified that patient has a positive gonorrhea screen that has not yet been treated.  They have been unable to reach her through the OB/GYN office.  Had any known this, we would have given her a 1/2 gram of Rocephin in the office. Viral URI discussed Final Clinical Impressions(s) / UC Diagnoses   Final diagnoses:  Viral upper respiratory tract infection     Discharge Instructions     Take the claritin D 2 x a day for the cold symptoms Take ibuprofen as needed throat pain Drink plenty of fluids    ED Prescriptions    Medication Sig Dispense Auth. Provider   loratadine-pseudoephedrine (CLARITIN-D 12 HOUR) 5-120 MG tablet Take 1 tablet by mouth 2 (two) times daily. 30 tablet Raylene Everts, MD   ibuprofen (ADVIL) 600 MG tablet Take 1 tablet (600 mg total) by mouth every 6 (six) hours as needed for mild pain, moderate pain or cramping. 30 tablet Raylene Everts, MD     PDMP not reviewed this encounter.   Raylene Everts, MD 11/18/19 1150    Raylene Everts, MD 11/18/19 [phone]

## 2019-11-18 NOTE — Discharge Instructions (Addendum)
Take the claritin D 2 x a day for the cold symptoms Take ibuprofen as needed throat pain Drink plenty of fluids

## 2019-11-21 ENCOUNTER — Telehealth (HOSPITAL_COMMUNITY): Payer: Self-pay

## 2019-11-21 NOTE — Telephone Encounter (Signed)
Called ALL numbers listed to give message to [place] about STI treatment and I left a message with the nurse from [facility](Susan Odom) for [place] to give [place] a callback.

## 2019-11-22 ENCOUNTER — Telehealth: Payer: Self-pay | Admitting: *Deleted

## 2019-11-22 ENCOUNTER — Encounter: Payer: Self-pay | Admitting: *Deleted

## 2019-11-22 NOTE — Telephone Encounter (Signed)
Vikki Ports at [facility] returned call.  Shared with her that patient had untreated gonorrhea and would like to schedule time for her to come to urgent care to receive treatment.  She stated she would need to speak with client directly to set up appointment.  Deferred to Vikki Ports to get in touch with client.

## 2019-11-22 NOTE — Telephone Encounter (Signed)
Telephoned [facility] @ [phone] to speak with Raynald Blend, RN about patient's + gonorrhea result and trying to reach patient to have her come in for treatment.  Unable to reach nurse today.

## 2019-11-22 NOTE — Congregational Nurse Program (Signed)
  Dept: [phone]   Congregational Nurse Program Note  Date of Encounter: 11/22/2019  Spoke with patient to inform her she had + gonorrhea test at [facility].  Patient stated she had already been contacted by [facility] with results and has appointment scheduled for Tuesday, 11/29/19 @ 10:00 AM.  Will assist patient with arranging transportation with [facility] to go to appointment.  Past Medical History: Past Medical History:  Diagnosis Date  . Allergy    seasonal allergies  . Eclampsia 09/02/2018  . GERD (gastroesophageal reflux disease)   . History of heavy periods   . HPV (human papilloma virus) infection   . Ileus (HCC) 09/04/2018  . Severe preeclampsia, third trimester 09/02/2018    Encounter Details: CNP Questionnaire - 11/22/19 1733      Questionnaire   Patient Status  Not Applicable    Race  Black or African American    Location Patient Served At  [employer]    Uninsured  Not Applicable    Food  No food insecurities    Housing/Utilities  No permanent housing    Transportation  Yes, need transportation assistance    Interpersonal Safety  Yes, feel physically and emotionally safe where you currently live    Medication  Yes, have medication insecurities    Medical Provider  No    Referrals  Area Agency    ED Visit Averted  Not Applicable    Life-Saving Intervention Made  Not Applicable

## 2019-11-22 NOTE — Telephone Encounter (Signed)
Vikki Ports at [facility] called to say she was unable to reach patient and asked me to try reaching patient to pursue setting up appointment.

## 2019-11-22 NOTE — Telephone Encounter (Signed)
Attempted to call patient on cell number given to me by by patient's at [facility].  Cell number non-working.  Unable to leave message.

## 2019-11-22 NOTE — Telephone Encounter (Signed)
Telephoned patient at [phone] (new cell phone number given to me by patient's advocate William Dalton).  Left a VM for patient to return my call.  Informed her need to set up an appointment for her to receive treatment for  + test result she had at [facility].

## 2019-11-22 NOTE — Telephone Encounter (Signed)
Telephone Jamesha @ [place] [employer] to set up a ride for patient to her appointment.  Patient will be picked up at [address] @ 9:26 AM on 04/30/20 for the 10:00 AM appointment at the [facility]  [address]  [address]. Patient Intake Request Form and signed  [employer] Liability Waiver form scanned and emailed to [place] @ [email or web address].

## 2019-11-22 NOTE — Telephone Encounter (Signed)
Left a message at [facility] for Dr. Eustace Moore to return my call in regard to patient's positive gonorrhea diagnosis which has not been treated.

## 2019-11-22 NOTE — Telephone Encounter (Signed)
Attempted to call patient at [facility] phone [phone]. No answer.

## 2019-11-24 ENCOUNTER — Telehealth: Payer: Self-pay | Admitting: *Deleted

## 2019-11-24 ENCOUNTER — Encounter: Payer: Self-pay | Admitting: *Deleted

## 2019-11-24 NOTE — Telephone Encounter (Signed)
Telephoned Revonda Standard @ [facility] [employer] to set up ride for patient to [facility] for appointment on 5/25 @ 10:00 AM.

## 2019-11-24 NOTE — Telephone Encounter (Signed)
Telephoned [employer] [employer] to cancel ride for patient on 5/25 from [facility] to [facility].

## 2019-11-24 NOTE — Congregational Nurse Program (Signed)
  Dept: [phone]   Congregational Nurse Program Note  Date of Encounter: 11/24/2019  Patient desires to cancel [employer] [employer] for her appointment on 5/25.  States that she had a poor experience with transportation service in the past and doesn't desire to use them in the future.  Past Medical History: Past Medical History:  Diagnosis Date  . Allergy    seasonal allergies  . Eclampsia 09/02/2018  . GERD (gastroesophageal reflux disease)   . History of heavy periods   . HPV (human papilloma virus) infection   . Ileus (HCC) 09/04/2018  . Severe preeclampsia, third trimester 09/02/2018    Encounter Details: CNP Questionnaire - 11/24/19 2332      Questionnaire   Patient Status  Not Applicable    Race  Black or African American    Location Patient Served At  [employer]    Uninsured  Not Applicable    Food  No food insecurities    Housing/Utilities  No permanent housing    Transportation  Yes, need transportation assistance    Interpersonal Safety  Yes, feel physically and emotionally safe where you currently live    Medication  Yes, have medication insecurities    Medical Provider  No    Referrals  Urgent Equities trader;Medication Assistance    ED Visit Averted  Yes    Life-Saving Intervention Made  Not Applicable

## 2019-11-27 ENCOUNTER — Encounter: Payer: Self-pay | Admitting: *Deleted

## 2019-11-27 NOTE — Congregational Nurse Program (Signed)
  Dept: [place] Nurse Program Note  Date of Encounter:  11/22/19  Met with patient @ [facility] on 11/22/19 to follow up about her visit to [facility] for viral illness.  Patient feeling better. Inquired about her time in quarantine for her positive diagnosis of Covid-19.  She states that her symptoms were mild and she felt congested and tired.   Past Medical History: Past Medical History:  Diagnosis Date  . Allergy    seasonal allergies  . Eclampsia 09/02/2018  . GERD (gastroesophageal reflux disease)   . History of heavy periods   . HPV (human papilloma virus) infection   . Ileus ([place]) 09/04/2018  . Severe preeclampsia, third trimester 09/02/2018    Encounter Details: CNP Questionnaire - 11/27/19 2126      Questionnaire   Patient Status  Not Applicable    Race  Black or African American    Location Patient Served At  [employer]    Uninsured  Not Applicable    Food  No food insecurities    Housing/Utilities  No permanent housing    Transportation  Yes, need transportation assistance    Interpersonal Safety  Yes, feel physically and emotionally safe where you currently live    Medication  Yes, have medication insecurities    Medical Provider  No    Referrals  Area Agency;Primary Care Provider/Clinic;Urgent Care    ED Visit Averted  Not Applicable    Life-Saving Intervention Made  Not Applicable

## 2019-11-28 ENCOUNTER — Encounter: Payer: Self-pay | Admitting: *Deleted

## 2019-11-29 ENCOUNTER — Other Ambulatory Visit: Payer: Self-pay

## 2019-11-29 ENCOUNTER — Ambulatory Visit (INDEPENDENT_AMBULATORY_CARE_PROVIDER_SITE_OTHER): Payer: Medicaid Other

## 2019-11-29 DIAGNOSIS — A549 Gonococcal infection, unspecified: Secondary | ICD-10-CM

## 2019-11-29 MED ORDER — CEFTRIAXONE SODIUM 500 MG IJ SOLR
500.0000 mg | Freq: Once | INTRAMUSCULAR | Status: AC
  Administered 2019-11-29: 500 mg via INTRAMUSCULAR

## 2019-11-29 MED ORDER — AZITHROMYCIN 500 MG PO TABS
1000.0000 mg | ORAL_TABLET | Freq: Once | ORAL | Status: AC
  Administered 2019-11-29: 1000 mg via ORAL

## 2019-11-29 NOTE — Progress Notes (Signed)
Pt here today for STD treatment for Gonorrhea.  Pt requested to also get the tablets (Zithromax 1000 mg po) as well due to her partner being prescribed them as well.  Rocephin 500 mg administered IM and Zithromax 1000 mg tablets given as well.  Pt tolerated well.    Addison Naegeli, RN  11/29/19

## 2019-12-01 NOTE — Progress Notes (Signed)
Chart reviewed for nurse visit. Agree with plan of care.   Marylene Land, CNM 12/01/2019 10:04 AM

## 2019-12-13 ENCOUNTER — Encounter: Payer: Self-pay | Admitting: *Deleted

## 2019-12-13 NOTE — Congregational Nurse Program (Signed)
  Dept: [phone]   Congregational Nurse Program Note  Date of Encounter: 11/29/19 Assisted patient with Medicaid health plan and search for a primary care provider that accepts [facility]).   Assisted patient with  online chat assistant at [email or web address] to determine patient's status.  Effective 01/05/2020 patient will be enrolled in [address] [facility]).  Patient should receive a welcome health insurance packet by June 5th and after receiving packet should  call [phone] to determine  a PCP in her area.  Past Medical History: Past Medical History:  Diagnosis Date  . Allergy    seasonal allergies  . Eclampsia 09/02/2018  . GERD (gastroesophageal reflux disease)   . History of heavy periods   . HPV (human papilloma virus) infection   . Ileus (HCC) 09/04/2018  . Severe preeclampsia, third trimester 09/02/2018    Encounter Details: CNP Questionnaire - 11/29/19 1713      Questionnaire   Patient Status  Not Applicable    Race  Black or African American    Location Patient Served At  [employer]    Uninsured  Not Applicable    Food  No food insecurities    Housing/Utilities  No permanent housing    Transportation  Yes, need transportation assistance    Interpersonal Safety  Yes, feel physically and emotionally safe where you currently live    Medication  Yes, have medication insecurities    Medical Provider  No    Referrals  Primary Care Provider/Clinic;Area Agency;Other    ED Visit Averted  Not Applicable    Life-Saving Intervention Made  Not Applicable

## 2019-12-13 NOTE — Congregational Nurse Program (Signed)
  Dept: [phone]   Congregational Nurse Program Note  Date of Encounter: 11/29/19 Spoke  with patient in regard to nausea and lack of appetite this afternoon.  Patient states she received injection at [facility] today to treat gonorrhea and was informed that these symptoms can be a side effect of the medication.  Patient states she napped after lunch and the nausea resolved.  Past Medical History: Past Medical History:  Diagnosis Date  . Allergy    seasonal allergies  . Eclampsia 09/02/2018  . GERD (gastroesophageal reflux disease)   . History of heavy periods   . HPV (human papilloma virus) infection   . Ileus (HCC) 09/04/2018  . Severe preeclampsia, third trimester 09/02/2018    Encounter Details: CNP Questionnaire - 11/29/19 1704      Questionnaire   Patient Status  Not Applicable    Race  Black or African American    Location Patient Served At  [employer]    Uninsured  Not Applicable    Food  No food insecurities    Housing/Utilities  No permanent housing    Transportation  Yes, need transportation assistance    Interpersonal Safety  Yes, feel physically and emotionally safe where you currently live    Medication  Yes, have medication insecurities    Medical Provider  No    Referrals  Area Agency;Primary Care Provider/Clinic;Urgent Care    ED Visit Averted  Not Applicable    Life-Saving Intervention Made  Not Applicable

## 2019-12-20 ENCOUNTER — Encounter: Payer: Self-pay | Admitting: *Deleted

## 2019-12-20 NOTE — Congregational Nurse Program (Signed)
  Dept: [phone]   Congregational Nurse Program Note  Date of Encounter: 12/20/2019  Past Medical History: Past Medical History:  Diagnosis Date  . Allergy    seasonal allergies  . Eclampsia 09/02/2018  . GERD (gastroesophageal reflux disease)   . History of heavy periods   . HPV (human papilloma virus) infection   . Ileus (HCC) 09/04/2018  . Severe preeclampsia, third trimester 09/02/2018    Encounter Details:  CNP Questionnaire - 12/20/19 1610      Questionnaire   Patient Status Not Applicable    Race Black or African American    Location Patient Served At [employer]    Uninsured Not Applicable    Food No food insecurities    Housing/Utilities No permanent housing    Transportation Yes, need transportation assistance    Interpersonal Safety Yes, feel physically and emotionally safe where you currently live    Medication Yes, have medication insecurities    Medical Provider No    Referrals Primary Care Provider/Clinic;Area Agency;Other    ED Visit Averted Not Applicable    Life-Saving Intervention Made Not Applicable          Patient reports frequent crying almost daily in last 1 1/2 weeks.  She reports early morning awakening and difficulty going back to sleep.  Some mornings she awakens around 3:00 AM and cannot return to sleep at all.  Patient exhibits flat effect during conversation with Clinical research associate.  She reports feeling that she has no one.  She does not know her father and states her mother cannot assist her as her mother has no car and patient feels mother is unavailable emotionally.  She states she has relationship issues with father of her baby.  Patient states she would like medication to help with her feelings of sadness and desires counseling.  Will refer patient for mental health evaluation with nurse practitioner.

## 2019-12-27 ENCOUNTER — Telehealth: Payer: Self-pay | Admitting: *Deleted

## 2019-12-27 ENCOUNTER — Encounter: Payer: Self-pay | Admitting: *Deleted

## 2019-12-27 NOTE — Telephone Encounter (Signed)
Writer received request from [employer] at [facility] to follow up with patient to receive care at [facility].  Writer telephoned patient to inquire about arranging transportation to [facility].  Writer pursued several dates as options for patient to go to be seen.  Patient declined all options.

## 2019-12-27 NOTE — Congregational Nurse Program (Signed)
  Dept: [place] Nurse Program Note  Date of Encounter: 12/27/2019  Past Medical History: Past Medical History:  Diagnosis Date  . Allergy    seasonal allergies  . Eclampsia 09/02/2018  . GERD (gastroesophageal reflux disease)   . History of heavy periods   . HPV (human papilloma virus) infection   . Ileus ([place]) 09/04/2018  . Severe preeclampsia, third trimester 09/02/2018    Encounter Details:  CNP Questionnaire - 12/27/19 1739      Questionnaire   Race Black or African American    Location Patient Served At [employer] No food insecurities    Housing/Utilities No permanent housing    Transportation Yes, need transportation assistance    Interpersonal Safety Yes, feel physically and emotionally safe where you currently live    Medication Yes, have medication insecurities    Medical Provider No    Referrals Primary Care Provider/Clinic    ED Visit Averted Not Applicable    Life-Saving Intervention Made Not Applicable          Met with patient who desires to follow through with appointment at [facility] on 12/29/19 at 9:15 AM.  [place] [employer] to set up a ride for patient on 12/29/19.  Patient signed [facility] ride waiver form which was faxed to [phone].  Informed patient that driver will come to back of shelter and she can wait on back porch.  Approximate arrival time of ride will be 8:37 AM on 12/29/19.  Informed patient she will receive an automated reminder  telephone call 10 minutes prior to driver's arrival.   Patient reported that she will need to take her toddler daughter to the appointment.  Clarified with patient that she has a car seat and she will need to place child in car seat to have child ride in the car. Patient voiced understanding. Telephoned [place] [employer] to inform them that patient will be transporting child in a car seat to the appointment.  [facility]  reported that a note in regard to child in car seat noted in  transportation request.

## 2019-12-29 ENCOUNTER — Ambulatory Visit (INDEPENDENT_AMBULATORY_CARE_PROVIDER_SITE_OTHER): Payer: Medicaid Other | Admitting: Internal Medicine

## 2019-12-29 ENCOUNTER — Other Ambulatory Visit: Payer: Self-pay

## 2019-12-29 ENCOUNTER — Other Ambulatory Visit (HOSPITAL_COMMUNITY)
Admission: RE | Admit: 2019-12-29 | Discharge: 2019-12-29 | Disposition: A | Discharge status: Home / Self Care | Payer: Medicaid Other | Source: Ambulatory Visit | Attending: Internal Medicine | Admitting: Internal Medicine

## 2019-12-29 VITALS — BP 101/63 | HR 78 | Ht 62.0 in | Wt 91.2 lb

## 2019-12-29 DIAGNOSIS — F329 Major depressive disorder, single episode, unspecified: Secondary | ICD-10-CM | POA: Diagnosis not present

## 2019-12-29 DIAGNOSIS — N760 Acute vaginitis: Secondary | ICD-10-CM | POA: Diagnosis not present

## 2019-12-29 DIAGNOSIS — R569 Unspecified convulsions: Secondary | ICD-10-CM | POA: Diagnosis not present

## 2019-12-29 DIAGNOSIS — N920 Excessive and frequent menstruation with regular cycle: Secondary | ICD-10-CM

## 2019-12-29 DIAGNOSIS — A749 Chlamydial infection, unspecified: Secondary | ICD-10-CM

## 2019-12-29 DIAGNOSIS — F32A Depression, unspecified: Secondary | ICD-10-CM | POA: Insufficient documentation

## 2019-12-29 DIAGNOSIS — A549 Gonococcal infection, unspecified: Secondary | ICD-10-CM | POA: Insufficient documentation

## 2019-12-29 HISTORY — DX: Excessive and frequent menstruation with regular cycle: N92.0

## 2019-12-29 HISTORY — DX: Gonococcal infection, unspecified: A54.9

## 2019-12-29 MED ORDER — SERTRALINE HCL 25 MG PO TABS: 25.0000 mg | ORAL_TABLET | Freq: Every day | ORAL | 5 refills | Status: DC

## 2019-12-29 NOTE — Assessment & Plan Note (Signed)
Patient reports a history of heavy menstrual periods, she had been on Depo-Provera before and reports that she is not wanting to take this anymore.  When asked about contraception she reported that she was not sure and wanted to think about it.  She has seen OB/GYN in the past and we discussed that she may be able to return there or we can discuss contraception options.  -CBC and CMP

## 2019-12-29 NOTE — Assessment & Plan Note (Addendum)
Patient has a history of gonorrhea and was treated with an injection and pills.  She reports that her partner had been treated in [facility].  She states that she continues to have some vaginal discharge, pain with urination, she has not been sexually active since she had this.  On exam she does have some vaginal discharge, will repeat testing to evaluate for cure, and treat if necessary.  -Wet prep -Pregnancy test, HIV, and RPR  Addendum: Pregnancy test, HIV, RPR, chlamydia, and gonorrhea negative. Wet prep was positive for BV. Informed patient of results and sent in prescription for metronidazole for treatment. All questions and concerns were answered.

## 2019-12-29 NOTE — Patient Instructions (Addendum)
Victoria Turner,  It was a pleasure to see you today. Thank you for coming in.   Today we discussed your depression. In regards to this please start taking sertraline. I have placed a referral to the behavioral health specialist.    We also discussed your history of infection, we repeated the test today and will contact you with the results.   We also discussed your history of seizures. Please continue to monitor, contact us and go to the ED if you have another one.   Please return to clinic in 1 month or sooner if needed.   Thank you again for coming in.   Claudean Severance.D.

## 2019-12-29 NOTE — Progress Notes (Signed)
   CC: Establish care for her depression, prior STDs, history of eclampsia/seizures  HPI:  [name] is a 24 y.o. with a history listed below here to establish care. Patient reports that she is currently here because the shelter that she is living at wanted her to have a physical done.  Past Medical History:  Diagnosis Date  . Allergy    seasonal allergies  . Eclampsia 09/02/2018  . GERD (gastroesophageal reflux disease)   . History of heavy periods   . HPV (human papilloma virus) infection   . Ileus (HCC) 09/04/2018  . Severe preeclampsia, third trimester 09/02/2018   Review of Systems:   Constitutional: Negative for chills and fever.  Respiratory: Negative for shortness of breath.   Cardiovascular: Negative for chest pain and leg swelling.  Gastrointestinal: Negative for abdominal pain, nausea and vomiting.  Genitourinary: Positive for vaginal discharge, pain with urination. Neurological: Negative for dizziness and headaches.   Social: Reports using vape daily.  Occasional alcohol use.  Denies any drug use.  Lives at a shelter with her daughter.  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  Allergies: Seasonal denies any drug allergies.  Family history: Mother has hypertension.  Does not know of other family history.  Physical Exam:  Vitals:   12/29/19 0916  BP: 101/63  Pulse: 78  SpO2: 100%  Weight: 91 lb 3.2 oz (41.4 kg)  Height: 5\' 2"  (1.575 m)   Physical Exam Constitutional:      Appearance: Normal appearance.  HENT:     Head: Normocephalic and atraumatic.  Cardiovascular:     Rate and Rhythm: Normal rate and regular rhythm.     Pulses: Normal pulses.     Heart sounds: Normal heart sounds.  Pulmonary:     Effort: Pulmonary effort is normal.     Breath sounds: Normal breath sounds.  Abdominal:     General: Abdomen is flat. Bowel sounds are normal. There is no distension.     Palpations: Abdomen is soft.     Tenderness: There is no  abdominal tenderness.  Genitourinary:    General: Normal vulva.     Vagina: Vaginal discharge (White) present.  Musculoskeletal:        General: No swelling. Normal range of motion.     Cervical back: Normal range of motion and neck supple.  Skin:    General: Skin is warm and dry.     Capillary Refill: Capillary refill takes less than 2 seconds.  Neurological:     General: No focal deficit present.     Mental Status: She is alert and oriented to person, place, and time.  Psychiatric:        Mood and Affect: Mood normal.        Behavior: Behavior normal.     Assessment & Plan:   See Encounters Tab for problem based charting.  Patient discussed with Dr. 

## 2019-12-29 NOTE — Assessment & Plan Note (Addendum)
Patient has a history of depression and her PHQ-9 today is elevated at 17.  She reports feeling depressed at times and had previously thought about hurting herself, however she reports that she does not have these thoughts anymore and I do anything because her daughter.  She states that she was for several follow-up with a psychiatrist however on chart review it does not appear that she has an appointment with them at this time.  We discussed starting her on medications to help with her depression, discussed the side effects including risk of worsening depression and risk of worsening suicidal ideations.  She expressed understanding. Patient is currently living in the shelter, will need assistance with transportation and her living situation.   -Start sertraline 25 mg daily -[facility] behavioral [facility] referral, can referral to psychiatry from there -CCM referral -RTC in 1 month to evaluate efficacy

## 2019-12-29 NOTE — Assessment & Plan Note (Signed)
Patient reports that she has a history of seizures, she states that she has had them since she was 9, and they occurred during the summertime and the springtime.  She reports that they occur when she gets too hot and she will stand up and get lightheaded, she has been told that her eyes will roll back and she will not be responsive, and takes a while for her to get better.  On chart review, her pediatrician visit in 2017 did not note any seizures.  She does have a documented history of a Eclampsia and seizure secondary to pregnancy and significant hypertension.  She is not on any medications at this time and has never seen neurology.  Given her history of the seizure episodes and reported history of them occurring that was not related to being pregnant she likely will need anticonvulsants.  Patient will need a referral to neurology in the future to discuss this and further evaluate the seizures.

## 2019-12-30 ENCOUNTER — Encounter: Payer: Self-pay | Admitting: Internal Medicine

## 2019-12-30 LAB — RPR: RPR Ser Ql: NONREACTIVE

## 2019-12-30 LAB — CERVICOVAGINAL ANCILLARY ONLY
Bacterial Vaginitis (gardnerella): POSITIVE — AB
Candida Glabrata: NEGATIVE
Candida Vaginitis: NEGATIVE
Chlamydia: NEGATIVE
Comment: NEGATIVE
Comment: NEGATIVE
Comment: NEGATIVE
Comment: NEGATIVE
Comment: NEGATIVE
Comment: NORMAL
Neisseria Gonorrhea: NEGATIVE
Trichomonas: NEGATIVE

## 2019-12-30 LAB — CMP14 + ANION GAP
ALT: 5 IU/L (ref 0–32)
AST: 11 IU/L (ref 0–40)
Albumin/Globulin Ratio: 2.1 (ref 1.2–2.2)
Albumin: 4.9 g/dL (ref 3.9–5.0)
Alkaline Phosphatase: 69 IU/L (ref 48–121)
Anion Gap: 13 mmol/L (ref 10.0–18.0)
BUN/Creatinine Ratio: 16 (ref 9–23)
BUN: 12 mg/dL (ref 6–20)
Bilirubin Total: 0.4 mg/dL (ref 0.0–1.2)
CO2: 22 mmol/L (ref 20–29)
Calcium: 9.9 mg/dL (ref 8.7–10.2)
Chloride: 104 mmol/L (ref 96–106)
Creatinine, Ser: 0.77 mg/dL (ref 0.57–1.00)
GFR calc Af Amer: 127 mL/min/{1.73_m2} (ref >59)
GFR calc non Af Amer: 110 mL/min/{1.73_m2} (ref >59)
Globulin, Total: 2.3 g/dL (ref 1.5–4.5)
Glucose: 70 mg/dL (ref 65–99)
Potassium: 4.5 mmol/L (ref 3.5–5.2)
Sodium: 139 mmol/L (ref 134–144)
Total Protein: 7.2 g/dL (ref 6.0–8.5)

## 2019-12-30 LAB — CBC
Hematocrit: 39.5 % (ref 34.0–46.6)
Hemoglobin: 13.2 g/dL (ref 11.1–15.9)
MCH: 31.6 pg (ref 26.6–33.0)
MCHC: 33.4 g/dL (ref 31.5–35.7)
MCV: 95 fL (ref 79–97)
Platelets: 310 10*3/uL (ref 150–450)
RBC: 4.18 x10E6/uL (ref 3.77–5.28)
RDW: 12.6 % (ref 11.7–15.4)
WBC: 5.9 10*3/uL (ref 3.4–10.8)

## 2019-12-30 LAB — HIV ANTIBODY (ROUTINE TESTING W REFLEX): HIV Screen 4th Generation wRfx: NONREACTIVE

## 2019-12-30 LAB — BETA HCG QUANT (REF LAB): hCG Quant: <1 m[IU]/mL

## 2019-12-30 NOTE — Progress Notes (Signed)
Internal Medicine Clinic Attending  Case discussed with Dr. Krienke at the time of the visit.  We reviewed the resident's history and exam and pertinent patient test results.  I agree with the assessment, diagnosis, and plan of care documented in the resident's note.  Eivin Mascio, M.D., Ph.D.  

## 2020-01-02 ENCOUNTER — Ambulatory Visit: Payer: Self-pay

## 2020-01-02 MED ORDER — METRONIDAZOLE 500 MG PO TABS
500.0000 mg | ORAL_TABLET | Freq: Two times a day (BID) | ORAL | 0 refills | Status: AC
Start: 2020-01-02 — End: 2020-01-09

## 2020-01-02 NOTE — Chronic Care Management (AMB) (Signed)
  Chronic Care Management   Outreach Note  01/02/2020 Name: Lorrane Mccay MRN: [id number] DOB: Feb 19, 1996  Referred by: Eliezer Bottom, MD Reason for referral : Care Coordination [facility])   An unsuccessful telephone outreach was attempted today. The patient was referred to the case management team for assistance with care management and care coordination.   Follow Up Plan: Will attempt to contact patient again next week. If unsuccessful, will plan to meet with patient at upcoming appointment on 01/26/20     Malachy Chamber, BSW Embedded Care Coordination Social Worker [facility] [phone]

## 2020-01-02 NOTE — Addendum Note (Signed)
Addended by: Claudean Severance on: 01/02/2020 09:43 AM   Modules accepted: Orders

## 2020-01-03 ENCOUNTER — Encounter: Payer: Self-pay | Admitting: *Deleted

## 2020-01-03 ENCOUNTER — Telehealth: Payer: Self-pay | Admitting: *Deleted

## 2020-01-03 NOTE — Telephone Encounter (Signed)
Victoria Turner went to appointment on 12/29/19 at [facility] that writer set up for her.    [name] desired  to be assigned a primary care provider.  Eliezer Bottom, MD will be her PCP.

## 2020-01-03 NOTE — Congregational Nurse Program (Signed)
  Dept: [phone]   Congregational Nurse Program Note  Date of Encounter: 01/03/2020  Past Medical History: Past Medical History:  Diagnosis Date  . Allergy    seasonal allergies  . Eclampsia 09/02/2018  . GERD (gastroesophageal reflux disease)   . History of heavy periods   . HPV (human papilloma virus) infection   . Ileus (HCC) 09/04/2018  . Severe preeclampsia, third trimester 09/02/2018    Encounter Details:  CNP Questionnaire - 01/03/20 1730      Questionnaire   Patient Status Not Applicable    Race Black or African American    Location Patient Served At [employer]    Uninsured Not Applicable    Food No food insecurities    Housing/Utilities No permanent housing    Transportation Yes, need transportation assistance    Interpersonal Safety Yes, feel physically and emotionally safe where you currently live    Medication No medication insecurities    Medical Provider Yes    Referrals Area Agency;Behavioral/Mental Health Provider    ED Visit Averted Not Applicable    Life-Saving Intervention Made Not Applicable         Patient reports that she was happy  with her visit to [facility] and satisfied with the female PCP assigned to her.  She reports vomiting and lightheadedness and thinks that these are side effects of the medications she was prescribed on 12/29/19.  She states that PCP requested that she return for visit if she is having difficulty tolerating medication.  Patient will monitor how she feels tomorrow and will follow up with care provider if necessary.

## 2020-01-10 ENCOUNTER — Telehealth: Payer: Self-pay | Admitting: Licensed Clinical Social Worker

## 2020-01-10 NOTE — Telephone Encounter (Signed)
Patient was called to discuss a referral for services. Patient agreed, and will be added to my schedule for 7/14 @ 12:30 via phone.

## 2020-01-11 ENCOUNTER — Ambulatory Visit: Payer: Self-pay

## 2020-01-11 DIAGNOSIS — R569 Unspecified convulsions: Secondary | ICD-10-CM

## 2020-01-11 NOTE — Chronic Care Management (AMB) (Signed)
  Care Management    01/11/2020 Name: Victoria Turner MRN: [id number] DOB: [birth year]  Referred by: Eliezer Bottom, MD Reason for referral : Care Coordination (Community resources, medication assistance)   Victoria Turner is a 24 y.o. year old female who is a primary care patient of Aslam, Leanna Sato, MD. The care management team was consulted for assistance with chronic disease management and care coordination needs.   Review of patient status, including review of consultants reports, relevant laboratory and other test results, and collaboration with appropriate care team members and the patient's provider was performed as part of comprehensive patient evaluation and provision of care management services.    SDOH (Social Determinants of Health) assessments performed: No See Care Plan activities for detailed interventions related to SDOH)     Goals Addressed   None       [name] was given information about Care Management services today including:  1. Care Management services includes personalized support from designated clinical staff supervised by her physician, including individualized plan of care and coordination with other care providers 2. 24/7 contact phone numbers for assistance for urgent and routine care needs. 3. The patient may stop case management services at any time by phone call to the office staff.  Patient agreed to services and verbal consent obtained.    Follow up plan: Face to Face appointment with care management team member scheduled for: 01/26/20     Malachy Chamber, BSW Embedded Care Coordination Social Worker [facility] [phone]

## 2020-01-18 ENCOUNTER — Ambulatory Visit: Payer: Medicaid Other | Admitting: Licensed Clinical Social Worker

## 2020-01-18 ENCOUNTER — Telehealth: Payer: Self-pay | Admitting: Licensed Clinical Social Worker

## 2020-01-18 NOTE — Telephone Encounter (Signed)
Patient was called for her scheduled visit. Patient did not answer, and a vm was left for her to contact the office.

## 2020-01-26 ENCOUNTER — Telehealth: Payer: Medicaid Other

## 2020-01-26 ENCOUNTER — Ambulatory Visit: Payer: Self-pay

## 2020-01-26 ENCOUNTER — Encounter: Payer: Medicaid Other | Admitting: Internal Medicine

## 2020-01-26 NOTE — Chronic Care Management (AMB) (Signed)
°  Chronic Care Management   Outreach Note  01/26/2020 Name: Darcia Lampi MRN: [id number] DOB: 12-28-1995  Referred by: Eliezer Bottom, MD Reason for referral : Care Coordination [facility])   Contacted patient at scheduled time for initial SW assessment.  No answer; left message requesting return call.    Follow Up Plan: Will attempt to reach again next week if no return call.      Malachy Chamber, BSW Embedded Care Coordination Social Worker [facility] [phone]

## 2020-01-28 ENCOUNTER — Other Ambulatory Visit: Payer: Self-pay

## 2020-01-28 ENCOUNTER — Emergency Department (HOSPITAL_COMMUNITY)
Admission: EM | Admit: 2020-01-28 | Discharge: 2020-01-28 | Disposition: A | Discharge status: Home / Self Care | Payer: Medicaid Other | Attending: Emergency Medicine | Admitting: Emergency Medicine

## 2020-01-28 DIAGNOSIS — Z79899 Other long term (current) drug therapy: Secondary | ICD-10-CM | POA: Insufficient documentation

## 2020-01-28 DIAGNOSIS — Z20822 Contact with and (suspected) exposure to covid-19: Secondary | ICD-10-CM | POA: Insufficient documentation

## 2020-01-28 DIAGNOSIS — B349 Viral infection, unspecified: Secondary | ICD-10-CM | POA: Diagnosis not present

## 2020-01-28 DIAGNOSIS — F17210 Nicotine dependence, cigarettes, uncomplicated: Secondary | ICD-10-CM | POA: Diagnosis not present

## 2020-01-28 DIAGNOSIS — R509 Fever, unspecified: Secondary | ICD-10-CM | POA: Diagnosis present

## 2020-01-28 LAB — URINALYSIS, ROUTINE W REFLEX MICROSCOPIC
Bilirubin Urine: NEGATIVE
Glucose, UA: NEGATIVE mg/dL
Ketones, ur: NEGATIVE mg/dL
Leukocytes,Ua: NEGATIVE
Nitrite: NEGATIVE
Protein, ur: NEGATIVE mg/dL
Specific Gravity, Urine: 1.017 (ref 1.005–1.030)
pH: 5 (ref 5.0–8.0)

## 2020-01-28 LAB — SARS CORONAVIRUS 2 BY RT PCR (HOSPITAL ORDER, PERFORMED IN ~~LOC~~ HOSPITAL LAB): SARS Coronavirus 2: NEGATIVE

## 2020-01-28 LAB — GROUP A STREP BY PCR: Group A Strep by PCR: NOT DETECTED

## 2020-01-28 MED ORDER — ACETAMINOPHEN 325 MG PO TABS
650.0000 mg | ORAL_TABLET | Freq: Once | ORAL | Status: AC
  Administered 2020-01-28: 650 mg via ORAL
  Filled 2020-01-28: qty 2

## 2020-01-28 MED ORDER — IBUPROFEN 800 MG PO TABS
800.0000 mg | ORAL_TABLET | Freq: Once | ORAL | Status: AC
  Administered 2020-01-28: 800 mg via ORAL
  Filled 2020-01-28: qty 1

## 2020-01-28 NOTE — ED Provider Notes (Signed)
[facility] EMERGENCY DEPARTMENT Provider Note   CSN: [id number] Arrival date & time: 01/28/20  1220     History Chief Complaint  Patient presents with  . Fever    Victoria Turner is a 24 y.o. female.  HPI   Patient is a 24 year old female with medical history as noted below.  Patient states that beginning last night she began experiencing multiple symptoms.  She complains of fatigue, cough, sore throat, bilateral ear pain, congestion, body aches, fevers, chills.  She denies any known sick contacts.  She has not had the Covid vaccination.  She obtained the rapid strep test in triage and was negative.  She denies chest pain, shortness of breath, abdominal pain, nausea, vomiting, diarrhea, urinary changes, syncope.     Past Medical History:  Diagnosis Date  . Allergy    seasonal allergies  . Eclampsia 09/02/2018  . GERD (gastroesophageal reflux disease)   . History of heavy periods   . HPV (human papilloma virus) infection   . Ileus (HCC) 09/04/2018  . Severe preeclampsia, third trimester 09/02/2018    Patient Active Problem List   Diagnosis Date Noted  . Gonorrhea 12/29/2019  . Menorrhagia 12/29/2019  . Seizures (HCC) 12/29/2019  . Depression 12/29/2019  . Chlamydia infection 08/15/2019  . TOA (tubo-ovarian abscess) 08/10/2019  . High risk sexual behavior 08/15/2015  . 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 Other seasonal allergic rhinitis 01/12/2014    Past Surgical History:  Procedure Laterality Date  . CESAREAN SECTION N/A 09/02/2018   Procedure: CESAREAN SECTION;  Surgeon: Willodean Rosenthal, MD;  Location: [facility] LD ORS;  Service: Obstetrics;  Laterality: N/A;     OB History    Gravida  1   Para  1   Term  0   Preterm  1   AB  0   Living  1     SAB  0   TAB  0   Ectopic  0   Multiple      Live Births  1           Family History  Problem Relation Age of Onset  . Hypertension Mother   . Autism  Sister   . Autism Brother   . Autism Sister   . Autism Sister   . ADD / ADHD Sister     Social History   Tobacco Use  . Smoking status: Current Some Day Smoker  . Smokeless tobacco: Never Used  . Tobacco comment: black and mild 4x week  Vaping Use  . Vaping Use: Never used  Substance Use Topics  . Alcohol use: Never  . Drug use: Not Currently    Types: Marijuana    Home Medications Prior to Admission medications   Medication Sig Start Date End Date Taking? Authorizing Provider  ibuprofen (ADVIL) 600 MG tablet Take 1 tablet (600 mg total) by mouth every 6 (six) hours as needed for mild pain, moderate pain or cramping. 11/18/19   Eustace Moore, MD  loratadine-pseudoephedrine (CLARITIN-D 12 HOUR) 5-120 MG tablet Take 1 tablet by mouth 2 (two) times daily. 11/18/19   Eustace Moore, MD  sertraline (ZOLOFT) 25 MG tablet Take 1 tablet (25 mg total) by mouth daily. 12/29/19   Claudean Severance, MD  traMADol (ULTRAM) 50 MG tablet Take 1 tablet (50 mg total) by mouth every 6 (six) hours as needed for moderate pain. 08/15/19   Anyanwu, Jethro Bastos, MD  norethindrone (MICRONOR,CAMILA,ERRIN) 0.35 MG tablet Take  1 tablet (0.35 mg total) by mouth daily. Patient not taking: Reported on 08/10/2019 10/06/18 11/18/19  Conan Bowens, MD    Allergies    Pollen extract  Review of Systems   Review of Systems  All other systems reviewed and are negative. Ten systems reviewed and are negative for acute change, except as noted in the HPI.   Physical Exam Updated Vital Signs BP 97/70   Pulse (!) 121   Temp 99 F (37.2 C) (Oral)   Resp 14   Ht 5\' 2"  (1.575 m)   Wt (!) 40.8 kg   SpO2 100%   BMI 16.46 kg/m   Physical Exam Vitals and nursing note reviewed.  Constitutional:      General: She is not in acute distress.    Appearance: Normal appearance. She is not ill-appearing, toxic-appearing or diaphoretic.     Comments: Well-developed adult female who is fatigued appearing.  She speaks  clearly coherently as well as in complete sentences.  HENT:     Head: Normocephalic and atraumatic.     Right Ear: Tympanic membrane, ear canal and external ear normal. There is no impacted cerumen.     Left Ear: Tympanic membrane, ear canal and external ear normal. There is no impacted cerumen.     Ears:     Comments: Bilateral EACs are clear.  TMs are pearly gray with good cone of light.    Nose: Nose normal. No congestion or rhinorrhea.     Mouth/Throat:     Mouth: Mucous membranes are moist.     Pharynx: Oropharynx is clear. No oropharyngeal exudate or posterior oropharyngeal erythema.     Comments: Mild red streaking noted in the posterior oropharynx, otherwise no erythema noted.  No tonsillar hypertrophy appreciated.  No exudates.  Patient is readily handling secretions.  No hot potato voice. Eyes:     General: No scleral icterus.       Right eye: No discharge.        Left eye: No discharge.     Extraocular Movements: Extraocular movements intact.     Conjunctiva/sclera: Conjunctivae normal.     Pupils: Pupils are equal, round, and reactive to light.  Cardiovascular:     Rate and Rhythm: Regular rhythm. Tachycardia present.     Pulses: Normal pulses.     Heart sounds: Normal heart sounds. No murmur heard.  No friction rub. No gallop.      Comments: Mild tachycardia noted during the exam.  No murmurs, rubs, gallops. Pulmonary:     Effort: Pulmonary effort is normal. No respiratory distress.     Breath sounds: Normal breath sounds. No stridor. No wheezing, rhonchi or rales.     Comments: Lungs clear to auscultation bilaterally. Abdominal:     General: Abdomen is flat.     Palpations: Abdomen is soft.     Tenderness: There is no abdominal tenderness.     Comments: Abdomen is soft and nontender.  Musculoskeletal:        General: Tenderness present. Normal range of motion.     Cervical back: Normal range of motion and neck supple. No tenderness.     Comments: Mild TTP noted  diffusely in the lumbar region.  No midline C, T, L-spine tenderness.  Skin:    General: Skin is warm and dry.  Neurological:     General: No focal deficit present.     Mental Status: She is alert and oriented to person, place, and time.  Psychiatric:  Mood and Affect: Mood normal.        Behavior: Behavior normal.    ED Results / Procedures / Treatments   Labs (all labs ordered are listed, but only abnormal results are displayed) Labs Reviewed  URINALYSIS, ROUTINE W REFLEX MICROSCOPIC - Abnormal; Notable for the following components:      Result Value   APPearance HAZY (*)    Hgb urine dipstick LARGE (*)    Bacteria, UA RARE (*)    All other components within normal limits  GROUP A STREP BY PCR  SARS CORONAVIRUS 2 BY RT PCR (HOSPITAL ORDER, PERFORMED IN [facility])   EKG None  Radiology No results found.  Procedures Procedures   Medications Ordered in ED Medications  acetaminophen (TYLENOL) tablet 650 mg (650 mg Oral Given 01/28/20 1942)  ibuprofen (ADVIL) tablet 800 mg (800 mg Oral Given 01/28/20 1942)   ED Course  I have reviewed the triage vital signs and the nursing notes.  Pertinent labs & imaging results that were available during my care of the patient were reviewed by me and considered in my medical decision making (see chart for details).  Clinical Course as of Jan 28 2056  Sat Jan 28, 2020  1929 Low-grade temperature.  Will give patient APAP as well as ibuprofen.  Will obtain a Covid test.  Temp: 99.5 F (37.5 C) [LJ]  1930 Group A Strep by PCR [LJ]  2009 Patient is currently menstruating  Hgb urine dipstick(!): LARGE [LJ]    Clinical Course User Index [LJ] Placido Sou, PA-C   MDM Rules/Calculators/A&P                          Pt is a 24 y.o. female that present with a history, physical exam, ED Clinical Course as noted above.   Patient presents today with what appears to be a viral URI.  She was also having some vague  discomfort in the lumbar region, so I obtained a UA.  There was a large hemoglobin urine and patient stated that she is currently menstruating.  Rare bacteria.  Not concern for UTI at this time.  Rapid strep PCR negative.  COVID-19 test is pending at this time.  Patient request to go home and check these results on MyChart.  She understands she needs to quarantine if the results of this test is positive.  Patient has not been vaccinated for COVID-19 and I urged the patient to obtain her vaccination in the future.  We discussed multiple over-the-counter medications she can use to manage her symptoms.  I offered to prescribe cough medication of the patient but she declined.  Her questions were answered and she was amicable at time of discharge.  She understands she is to return to the ED with any new or worsening symptoms.  Recommended that she follow-up with her PCP if her symptoms are not improved in 1 week.   Patient discharged to home/self care.  Condition at discharge: Stable  Note: Portions of this report may have been transcribed using voice recognition software. Every effort was made to ensure accuracy; however, inadvertent computerized transcription errors may be present.   Final Clinical Impression(s) / ED Diagnoses Final diagnoses:  Viral illness   Rx / DC Orders ED Discharge Orders    None       Placido Sou, PA-C 01/28/20 2057    Wynetta Fines, MD 01/30/20 [phone]

## 2020-01-28 NOTE — ED Notes (Signed)
Patient verbalizes understanding of discharge instructions. Opportunity for questioning and answers were provided. Armband removed by staff, pt discharged from ED ambulatory.   

## 2020-01-28 NOTE — ED Triage Notes (Signed)
Pt arrives to ED w/ c/o fever, throat pain, ear pain, and lower back pain onset last night.

## 2020-01-28 NOTE — ED Notes (Signed)
Pt denied any feelings of SOB, dizziness, or faintness during her orthostatic vitals. Pt's only complaint was soreness in her abdomen and feet.

## 2020-01-28 NOTE — Discharge Instructions (Addendum)
Please return to the emergency department with any new or worsening symptoms.  Please check the results of your COVID-19 test on MyChart.  You need to quarantine for 10 days from symptom onset if you find your positive.  Please consider obtaining the COVID-19 vaccination in the future.  It was a pleasure to meet you.

## 2020-02-02 ENCOUNTER — Telehealth: Payer: Self-pay

## 2020-02-02 ENCOUNTER — Telehealth: Payer: Medicaid Other

## 2020-02-02 NOTE — Telephone Encounter (Signed)
  Chronic Care Management   Outreach Note  02/02/2020 Name: Victoria Turner MRN: [id number] DOB: 08-23-1995  Referred by: Eliezer Bottom, MD Reason for referral : Care Coordination [facility])   Third unsuccessful telephone outreach was attempted today. The patient was referred to the case management team for assistance with care management and care coordination. The patient's primary care provider has been notified of our unsuccessful attempts to make or maintain contact with the patient. The care management team is pleased to engage with this patient at any time in the future should he/she be interested in assistance from the care management team.   Follow Up Plan: The care management team is available to follow up with the patient after provider conversation with the patient regarding recommendation for care management engagement and subsequent re-referral to the care management team.       Malachy Chamber, BSW Embedded Care Coordination Social Worker [facility] [phone]

## 2020-02-14 ENCOUNTER — Ambulatory Visit: Payer: Medicaid Other | Admitting: Licensed Clinical Social Worker

## 2020-02-14 ENCOUNTER — Telehealth: Payer: Self-pay | Admitting: Licensed Clinical Social Worker

## 2020-02-14 NOTE — Telephone Encounter (Signed)
Patient was called for her scheduled visit. Patient did not answer, and a vm was left for the patient to call our office back to reschedule.  

## 2020-02-14 NOTE — Addendum Note (Signed)
Addended by: Neomia Dear on: 02/14/2020 05:57 PM   Modules accepted: Orders

## 2020-02-28 ENCOUNTER — Encounter: Payer: Medicaid Other | Admitting: Internal Medicine

## 2020-02-28 ENCOUNTER — Telehealth: Payer: Self-pay | Admitting: *Deleted

## 2020-02-28 NOTE — Telephone Encounter (Signed)
Call to patient about missed appointment.  Unable to reach or leave a message.  Angelina Ok, RN 8.24.2021 3:17 PM.

## 2020-04-10 ENCOUNTER — Ambulatory Visit: Payer: Self-pay

## 2020-04-10 NOTE — Chronic Care Management (AMB) (Signed)
CCM status changed to previously enrolled.   Fadil Macmaster, BSW Embedded Care Coordination Social Worker [facility] [phone]                             

## 2020-05-03 ENCOUNTER — Other Ambulatory Visit: Payer: Self-pay

## 2020-05-03 ENCOUNTER — Other Ambulatory Visit (HOSPITAL_COMMUNITY)
Admission: RE | Admit: 2020-05-03 | Discharge: 2020-05-03 | Disposition: A | Discharge status: Home / Self Care | Payer: Medicaid Other | Source: Ambulatory Visit | Attending: Internal Medicine | Admitting: Internal Medicine

## 2020-05-03 ENCOUNTER — Ambulatory Visit (INDEPENDENT_AMBULATORY_CARE_PROVIDER_SITE_OTHER): Payer: Medicaid Other | Admitting: Internal Medicine

## 2020-05-03 ENCOUNTER — Encounter: Payer: Self-pay | Admitting: Internal Medicine

## 2020-05-03 VITALS — BP 101/67 | HR 70 | Temp 98.3°F | Ht 62.0 in | Wt 92.8 lb

## 2020-05-03 DIAGNOSIS — N898 Other specified noninflammatory disorders of vagina: Secondary | ICD-10-CM | POA: Diagnosis not present

## 2020-05-03 DIAGNOSIS — Z3202 Encounter for pregnancy test, result negative: Secondary | ICD-10-CM | POA: Diagnosis not present

## 2020-05-03 DIAGNOSIS — N6315 Unspecified lump in the right breast, overlapping quadrants: Secondary | ICD-10-CM | POA: Diagnosis present

## 2020-05-03 DIAGNOSIS — Z7251 High risk heterosexual behavior: Secondary | ICD-10-CM | POA: Diagnosis not present

## 2020-05-03 DIAGNOSIS — N63 Unspecified lump in unspecified breast: Secondary | ICD-10-CM

## 2020-05-03 LAB — POCT URINE PREGNANCY: Preg Test, Ur: NEGATIVE

## 2020-05-03 NOTE — Progress Notes (Signed)
   CC: vaginal discharge and right breast lump  HPI:  [name] is a 24 y.o. female with PMHx as listed below presenting for evaluation of right breast lump that has been persistent for two months associated with "yellow colored" bilateral nipple discharge.  She also endorses two weeks of vaginal discharge that she notes is thick white with a fishy odor. She is sexually active and does not use contraception at this time. Concern for possible STI. Please see problem based charting for complete assessment and plan.  Past Medical History:  Diagnosis Date  . Allergy    seasonal allergies  . Eclampsia 09/02/2018  . GERD (gastroesophageal reflux disease)   . History of heavy periods   . HPV (human papilloma virus) infection   . Ileus (HCC) 09/04/2018  . Severe preeclampsia, third trimester 09/02/2018   Review of Systems:  Negative except as stated in HPI.  Physical Exam:  Vitals:   05/03/20 1455  BP: 101/67  Pulse: 70  Temp: 98.3 F (36.8 C)  TempSrc: Oral  SpO2: 100%  Weight: 92 lb 12.8 oz (42.1 kg)  Height: 5\' 2"  (1.575 m)   Physical Exam  Constitutional: Appears well-developed and well-nourished. No distress.  Cardiovascular: Normal rate, regular rhythm, S1 and S2 present, no murmurs, rubs, gallops.  Distal pulses intact Breast: Noted to have a 2-3cm nodule with irregular borders at the 12-3 o'clock position of right breast without overlying erythema, edema, tenderness or warmth; no axillary lymphadenopathy appreciated  Respiratory: No respiratory distress, no accessory muscle use.  Effort is normal.  Lungs are clear to auscultation bilaterally. GI: Nondistended, soft, nontender to palpation, normal active bowel sounds GU: normal appearing vulva; vaginal wells well rugated and without any lesions; cervix with minimal light brown discharge, no lesions noted Skin: Warm and dry.  No rash, erythema, lesions noted. Psychiatric: Normal mood and affect. Behavior is  normal. Judgment and thought content normal.    Assessment & Plan:   See Encounters Tab for problem based charting.  Patient discussed with Dr. 

## 2020-05-03 NOTE — Patient Instructions (Addendum)
It was a pleasure seeing you in clinic. Today we discussed:   Breast lump: I will refer you to the breast clinic. They will further evaluate with mammogram vs ultrasound   Vaginal discharge: I am getting some tests and will call you with the results   If you have any questions or concerns, please call our clinic at [phone] between 9am-5pm and after hours call [phone] and ask for the internal medicine resident on call. If you feel you are having a medical emergency please call 911.   Thank you, we look forward to helping you remain healthy!

## 2020-05-04 ENCOUNTER — Encounter: Payer: Self-pay | Admitting: Internal Medicine

## 2020-05-04 DIAGNOSIS — N6315 Unspecified lump in the right breast, overlapping quadrants: Secondary | ICD-10-CM | POA: Insufficient documentation

## 2020-05-04 DIAGNOSIS — N898 Other specified noninflammatory disorders of vagina: Secondary | ICD-10-CM | POA: Insufficient documentation

## 2020-05-04 LAB — URINALYSIS, ROUTINE W REFLEX MICROSCOPIC
Bilirubin, UA: NEGATIVE
Glucose, UA: NEGATIVE
Nitrite, UA: NEGATIVE
RBC, UA: NEGATIVE
Specific Gravity, UA: 1.025 (ref 1.005–1.030)
Urobilinogen, Ur: 1 mg/dL (ref 0.2–1.0)
pH, UA: 6 (ref 5.0–7.5)

## 2020-05-04 LAB — CERVICOVAGINAL ANCILLARY ONLY
Bacterial Vaginitis (gardnerella): POSITIVE — AB
Candida Glabrata: NEGATIVE
Candida Vaginitis: NEGATIVE
Chlamydia: NEGATIVE
Comment: NEGATIVE
Comment: NEGATIVE
Comment: NEGATIVE
Comment: NEGATIVE
Comment: NEGATIVE
Comment: NORMAL
Neisseria Gonorrhea: NEGATIVE
Trichomonas: NEGATIVE

## 2020-05-04 LAB — MICROSCOPIC EXAMINATION
Casts: NONE SEEN /lpf
RBC, Urine: NONE SEEN /hpf (ref 0–2)
WBC, UA: >30 /hpf — AB (ref 0–5)

## 2020-05-04 LAB — RPR: RPR Ser Ql: NONREACTIVE

## 2020-05-04 LAB — HIV ANTIBODY (ROUTINE TESTING W REFLEX): HIV Screen 4th Generation wRfx: NONREACTIVE

## 2020-05-04 MED ORDER — METRONIDAZOLE 500 MG PO TABS: 500.0000 mg | ORAL_TABLET | Freq: Two times a day (BID) | ORAL | 0 refills | Status: AC

## 2020-05-04 MED ORDER — EMTRICITABINE-TENOFOVIR DF 200-300 MG PO TABS: 1.0000 | ORAL_TABLET | Freq: Every day | ORAL | 0 refills | Status: AC

## 2020-05-04 MED ORDER — NITROFURANTOIN MONOHYD MACRO 100 MG PO CAPS: 100.0000 mg | ORAL_CAPSULE | Freq: Two times a day (BID) | ORAL | 0 refills | Status: AC

## 2020-05-04 MED ORDER — DOLUTEGRAVIR SODIUM 50 MG PO TABS: 50.0000 mg | ORAL_TABLET | Freq: Every day | ORAL | 0 refills | Status: DC

## 2020-05-04 NOTE — Assessment & Plan Note (Signed)
Patient endorses two months of progressively enlarging breast lump on the right side that is intermittently tender. She denies any trauma to the area and denies any association with her menstrual cycle. She is unaware of any family history of breast cancer. She also endorses bilateral yellow colored nipple discharge. She is not currently breast feeding. She does endorse a mild right sided headache for two days but no associated vision changes On examination, patient has a 2-3cm nodule with irregular borders extending from the 12 o'clock position to 2 o'clock. She does not have any overlying erythema, edema, warmth and does not have tenderness to palpation. No axillary lymphadenopathy appreciated on exam. Unable to replicate nipple discharge on examination. Pregnancy test at this visit is negative.   Plan: Diagnostic mammogram and [place] of R breast  If her nipple discharge is persistent, or development of other neurologic signs, will consider head CT to r/o pituitary tumor

## 2020-05-04 NOTE — Assessment & Plan Note (Signed)
Patient is endorsing two weeks of vaginal discharge that is thick white with a fishy odor. She notes that initially she had dysuria that resolved and now has mostly vaginal discharge. On examination, she she minimal thick light brown cervical discharge. No other abnormalities noted. Cervicovaginal ancillary testing positive for BV - will treat with flagyl 500mg  x 7 days.  Urinalysis significant for >50 WBC's and bacteria.     Plan: Flagyl 500mg  bid x 7 days  Macrobid 100mg  bid x 5 days for UTI

## 2020-05-04 NOTE — Assessment & Plan Note (Addendum)
Patient with history of high risk sexual behavior with prior history of gonorrhea. Not currently using barrier contraception. HIV negative. Offered PEP.  Patient does not wish to use oral contraceptives at this time   Plan: Truvada 200-300mg  + dolutegravir 50mg  daily for 4 weeks Counsel on use of barrier contraception

## 2020-05-07 NOTE — Progress Notes (Signed)
Internal Medicine Clinic Attending ° °Case discussed with Dr. Aslam  At the time of the visit.  We reviewed the resident’s history and exam and pertinent patient test results.  I agree with the assessment, diagnosis, and plan of care documented in the resident’s note.  °

## 2020-06-28 ENCOUNTER — Telehealth: Payer: Self-pay | Admitting: *Deleted

## 2020-06-28 ENCOUNTER — Encounter: Payer: Medicaid Other | Admitting: Student

## 2020-06-28 NOTE — Telephone Encounter (Signed)
Call to patient regarding missed appointment for today.  Message left on Cell Phone that Clinics had called and to call to reschedule appointment if needed.   Angelina Ok, RN 06/28/2020 10:05 AM.

## 2020-07-25 ENCOUNTER — Other Ambulatory Visit: Payer: Self-pay | Admitting: Internal Medicine

## 2020-07-25 ENCOUNTER — Other Ambulatory Visit: Payer: Self-pay

## 2020-07-25 ENCOUNTER — Ambulatory Visit
Admission: RE | Admit: 2020-07-25 | Discharge: 2020-07-25 | Disposition: A | Discharge status: Home / Self Care | Payer: Medicaid Other | Source: Ambulatory Visit | Attending: Internal Medicine | Admitting: Internal Medicine

## 2020-07-25 DIAGNOSIS — N63 Unspecified lump in unspecified breast: Secondary | ICD-10-CM

## 2020-07-30 ENCOUNTER — Ambulatory Visit
Admission: RE | Admit: 2020-07-30 | Discharge: 2020-07-30 | Disposition: A | Discharge status: Home / Self Care | Payer: Medicaid Other | Source: Ambulatory Visit | Attending: Internal Medicine | Admitting: Internal Medicine

## 2020-07-30 ENCOUNTER — Other Ambulatory Visit: Payer: Self-pay | Admitting: Internal Medicine

## 2020-07-30 ENCOUNTER — Other Ambulatory Visit: Payer: Self-pay

## 2020-07-30 DIAGNOSIS — N63 Unspecified lump in unspecified breast: Secondary | ICD-10-CM

## 2020-08-10 ENCOUNTER — Encounter: Payer: Medicaid Other | Admitting: Student

## 2020-10-10 ENCOUNTER — Other Ambulatory Visit: Payer: Self-pay

## 2020-10-10 ENCOUNTER — Other Ambulatory Visit (HOSPITAL_COMMUNITY)
Admission: RE | Admit: 2020-10-10 | Discharge: 2020-10-10 | Disposition: A | Discharge status: Home / Self Care | Payer: Medicaid Other | Source: Ambulatory Visit | Attending: Internal Medicine | Admitting: Internal Medicine

## 2020-10-10 ENCOUNTER — Ambulatory Visit: Payer: Medicaid Other | Admitting: Internal Medicine

## 2020-10-10 VITALS — BP 93/64 | HR 84 | Temp 98.3°F | Ht 62.0 in | Wt 91.0 lb

## 2020-10-10 DIAGNOSIS — N898 Other specified noninflammatory disorders of vagina: Secondary | ICD-10-CM

## 2020-10-10 DIAGNOSIS — Z7251 High risk heterosexual behavior: Secondary | ICD-10-CM | POA: Insufficient documentation

## 2020-10-10 DIAGNOSIS — N6315 Unspecified lump in the right breast, overlapping quadrants: Secondary | ICD-10-CM | POA: Diagnosis not present

## 2020-10-10 MED ORDER — DESCOVY 200-25 MG PO TABS: 1.0000 | ORAL_TABLET | Freq: Every day | ORAL | 2 refills | Status: DC

## 2020-10-10 NOTE — Assessment & Plan Note (Signed)
Patient has a history of high risk sexual behavior with prior history of gonorrhea with PID. Has been HIV negative in the past. At last visit, patient offered PEP for which she is agreeable.  HIV negative at last visit and patient endorses using barrier contraception. However, given likelihood of recurrent STD, will continue with PrEP. Discussed recommended regimen is Descovy 1 tablet daily for which she is agreeable.  Plan: Discontinue Tivicay  Descovy 200-25mg  1 tablet daily  Continue to counsel on use of barrier contraception

## 2020-10-10 NOTE — Assessment & Plan Note (Addendum)
Patient is presenting for evaluation of three days of malodorous vaginal discharge. She notes that this is thick in consistency and reports it to be clear in nature. She denies any fishy odor but also endorses that it just smells bad. She denies any dysuria or other urinary symptoms at this time. She denies any recent sexual activity. On exam, noted to have thick yellowish discharge from cervix and on vaginal walls. No cervical motion tenderness noted. Remainder of exam benign.   Plan: Cervicovaginal ancillary testing for GC/Chalmydia/BV/Trichomonas HIV and RPR testing  Counseled on using barrier contraception    ADDENDUM: +ve for BV and Trichomonas - Flagyl 500mg  bid x 7 days HIV and RPR negative

## 2020-10-10 NOTE — Progress Notes (Signed)
   CC: vaginal odor   HPI:  Victoria Turner is a 25 y.o. female with PMHx as listed below presenting for foul smelling vaginal odor and scant clear vaginal discharge for three days duration. Denies dysuria or other urinary symptoms. Denies recent sexual activity. Please see problem based charting for complete assessment and plan.   Past Medical History:  Diagnosis Date  . Allergy    seasonal allergies  . Eclampsia 09/02/2018  . GERD (gastroesophageal reflux disease)   . Gonorrhea 12/29/2019  . History of heavy periods   . HPV (human papilloma virus) infection   . Ileus (HCC) 09/04/2018  . Severe preeclampsia, third trimester 09/02/2018   Review of Systems:  Negative except as stated in HPI.  Physical Exam:  Vitals:   10/10/20 0859  BP: 93/64  Pulse: 84  Temp: 98.3 F (36.8 C)  TempSrc: Oral  SpO2: 100%  Weight: 91 lb (41.3 kg)  Height: 5\' 2"  (1.575 m)   Physical Exam  Constitutional: Appears well-developed and well-nourished. No distress.  HENT: Normocephalic and atraumatic Cardiovascular: Normal rate, regular rhythm, S1 and S2 present, no murmurs, rubs, gallops.  Distal pulses intact, reproducible tenderness to palpation of R anterior chest Respiratory: No respiratory distress, Lungs are clear to auscultation bilaterally. Musculoskeletal: Normal bulk and tone.  No peripheral edema noted. Neurological: Is alert and oriented x4, no apparent focal deficits noted. Skin: Warm and dry.  No rash, erythema, lesions noted. GU: external genitalia wnl, thick purulent yellowish discharge from cervix noted on vaginal walls; no CMT   Assessment & Plan:   See Encounters Tab for problem based charting.  Patient discussed with Dr. 

## 2020-10-10 NOTE — Patient Instructions (Signed)
[name],  It was a pleasure seeing you in clinic. Today we discussed:   Vaginal odor/discharge: I am sending labs for this and will call you with any abnormal results and medications as needed.    If you have any questions or concerns, please call our clinic at [phone] between 9am-5pm and after hours call [phone] and ask for the internal medicine resident on call. If you feel you are having a medical emergency please call 911.   Thank you, we look forward to helping you remain healthy!

## 2020-10-10 NOTE — Assessment & Plan Note (Signed)
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 She does endorse some right sided pressure on exam today. Denies any new lump formation. Exam significant for reproducible right anterior chest pain that is nonradiating. Cardiac exam is wnl. She does note history of trauma to the area. Advised for symptomatic treatment and continued observation.

## 2020-10-11 ENCOUNTER — Telehealth: Payer: Self-pay

## 2020-10-11 LAB — CERVICOVAGINAL ANCILLARY ONLY
Bacterial Vaginitis (gardnerella): POSITIVE — AB
Candida Glabrata: NEGATIVE
Candida Vaginitis: NEGATIVE
Chlamydia: NEGATIVE
Comment: NEGATIVE
Comment: NEGATIVE
Comment: NEGATIVE
Comment: NEGATIVE
Comment: NEGATIVE
Comment: NORMAL
Neisseria Gonorrhea: NEGATIVE
Trichomonas: POSITIVE — AB

## 2020-10-11 NOTE — Telephone Encounter (Signed)
Requesting lab result, please call pt back.  

## 2020-10-11 NOTE — Progress Notes (Signed)
Internal Medicine Clinic Attending  Case discussed with Dr. Aslam at the time of the visit.  We reviewed the resident's history and exam and pertinent patient test results.  I agree with the assessment, diagnosis, and plan of care documented in the resident's note.  Lonzo Saulter, M.D., Ph.D.  

## 2020-10-12 ENCOUNTER — Encounter: Payer: Self-pay | Admitting: Internal Medicine

## 2020-10-12 LAB — RPR: RPR Ser Ql: NONREACTIVE

## 2020-10-12 LAB — HIV ANTIBODY (ROUTINE TESTING W REFLEX): HIV Screen 4th Generation wRfx: NONREACTIVE

## 2020-10-12 LAB — HEPATITIS C ANTIBODY: Hep C Virus Ab: <0.1 s/co ratio (ref 0.0–0.9)

## 2020-10-12 MED ORDER — METRONIDAZOLE 500 MG PO TABS
500.0000 mg | ORAL_TABLET | Freq: Two times a day (BID) | ORAL | 0 refills | Status: AC
Start: 2020-10-12 — End: 2020-10-19

## 2020-10-12 NOTE — Telephone Encounter (Signed)
MyChart message sent to patient discussing results and recommended medications.

## 2020-10-12 NOTE — Addendum Note (Signed)
Addended by: Eliezer Bottom on: 10/12/2020 09:15 AM   Modules accepted: Orders

## 2020-12-13 ENCOUNTER — Other Ambulatory Visit: Payer: Self-pay

## 2020-12-13 ENCOUNTER — Encounter (HOSPITAL_COMMUNITY): Payer: Self-pay

## 2020-12-13 ENCOUNTER — Ambulatory Visit (HOSPITAL_COMMUNITY)
Admission: EM | Admit: 2020-12-13 | Discharge: 2020-12-13 | Disposition: A | Discharge status: Home / Self Care | Payer: Medicaid Other | Attending: Family Medicine | Admitting: Family Medicine

## 2020-12-13 DIAGNOSIS — J029 Acute pharyngitis, unspecified: Secondary | ICD-10-CM | POA: Diagnosis present

## 2020-12-13 LAB — POCT RAPID STREP A, ED / UC: Streptococcus, Group A Screen (Direct): NEGATIVE

## 2020-12-13 LAB — POCT INFECTIOUS MONO SCREEN, ED / UC: Mono Screen: NEGATIVE

## 2020-12-13 MED ORDER — PREDNISONE 20 MG PO TABS: 40.0000 mg | ORAL_TABLET | Freq: Every day | ORAL | 0 refills | Status: DC

## 2020-12-13 NOTE — Discharge Instructions (Addendum)
You may use over the counter ibuprofen or acetaminophen as needed.  For a sore throat, over the counter products such as [employer] Peroxyl Mouth Sore Rinse or Chloraseptic Sore Throat Spray may provide some temporary relief. Your rapid strep test was negative today. We have sent your throat swab for culture and will let you know of any positive results. 

## 2020-12-13 NOTE — ED Provider Notes (Signed)
[facility]   [id number] 12/13/20 Arrival Time: 0910  ASSESSMENT & PLAN:  1. Sore throat    No signs of peritonsillar abscess. Discussed.  Begin trial of: Meds ordered this encounter  Medications   predniSONE (DELTASONE) 20 MG tablet    Sig: Take 2 tablets (40 mg total) by mouth daily.    Dispense:  10 tablet    Refill:  0    Labs Reviewed  CULTURE, GROUP A STREP [facility])  POCT RAPID STREP A, ED / UC  POCT INFECTIOUS MONO SCREEN, ED / UC     Discharge Instructions      You may use over the counter ibuprofen or acetaminophen as needed.  For a sore throat, over the counter products such as [employer] Peroxyl Mouth Sore Rinse or Chloraseptic Sore Throat Spray may provide some temporary relief. Your rapid strep test was negative today. We have sent your throat swab for culture and will let you know of any positive results.     Reviewed expectations re: course of current medical issues. Questions answered. Outlined signs and symptoms indicating need for more acute intervention. Patient verbalized understanding. After Visit Summary given.   SUBJECTIVE:  Victoria Turner is a 25 y.o. female who reports a sore throat. Describes as sharp pain with swallowing. Onset gradual beginning a week ago. Symptoms have progressed to a point and plateaued since beginning; without voice changes. No respiratory symptoms. Normal PO intake but reports discomfort with swallowing. No specific alleviating factors. Fever: believed to be present, temp not taken. No neck pain or swelling. No associated nausea, vomiting, or abdominal pain. Known sick contacts: none. Recent travel: none. OTC treatment: none PTA. Also complains of fatigue past week..  Patient's last menstrual period was 12/11/2020 (exact date).   OBJECTIVE:  Vitals:   12/13/20 1000 12/13/20 1001  BP:  97/66  Pulse: 80   Resp: 18   Temp: 99.1 F (37.3 C)   TempSrc: Oral   SpO2: 100%      General  appearance: alert; no distress HEENT: throat with moderate erythema with mild tonsil enlargement; uvula is midline Neck: supple with FROM; small cerv bilaterally Lungs: speaks full sentences without difficulty; unlabored Abd: soft; non-tender Skin: reveals no rash; warm and dry Psychological: alert and cooperative; normal mood and affect  Allergies  Allergen Reactions   Pollen Extract     Past Medical History:  Diagnosis Date   Allergy    seasonal allergies   Eclampsia 09/02/2018   GERD (gastroesophageal reflux disease)    Gonorrhea 12/29/2019   History of heavy periods    HPV (human papilloma virus) infection    Ileus (HCC) 09/04/2018   Severe preeclampsia, third trimester 09/02/2018   Social History   Socioeconomic History   Marital status: Single    Spouse name: Not on file   Number of children: Not on file   Years of education: Not on file   Highest education level: Not on file  Occupational History   Not on file  Tobacco Use   Smoking status: Some Days    Pack years: 0.00   Smokeless tobacco: Never   Tobacco comments:    black and mild 4x week  Vaping Use   Vaping Use: Never used  Substance and Sexual Activity   Alcohol use: Never   Drug use: Not Currently    Types: Marijuana   Sexual activity: Not on file  Other Topics Concern   Not on file  Social History Narrative   **  Merged History Encounter **       In foster (kinship) care with her biological paternal aunt and 3 of her 7 siblings   Social Determinants of Health   Financial Resource Strain: Not on file  Food Insecurity: Not on file  Transportation Needs: Not on file  Physical Activity: Not on file  Stress: Not on file  Social Connections: Not on file  Intimate Partner Violence: Not on file   Family History  Problem Relation Age of Onset   Hypertension Mother    Autism Sister    Autism Brother    Autism Sister    Autism Sister    ADD / ADHD Sister            Mardella Layman,  MD 12/13/20 1128

## 2020-12-13 NOTE — ED Triage Notes (Signed)
Pt presents with a sore throat x 1 week. Pt states she had Strep and states she has felt worse. She states she has ear pain, cough and muscle aches.

## 2020-12-15 LAB — CULTURE, GROUP A STREP (THRC)

## 2021-01-08 ENCOUNTER — Encounter: Payer: Self-pay | Admitting: *Deleted

## 2021-04-07 ENCOUNTER — Ambulatory Visit
Admission: EM | Admit: 2021-04-07 | Discharge: 2021-04-07 | Disposition: A | Discharge status: Home / Self Care | Payer: Medicaid Other | Attending: Urgent Care | Admitting: Urgent Care

## 2021-04-07 ENCOUNTER — Other Ambulatory Visit: Payer: Self-pay

## 2021-04-07 DIAGNOSIS — N6310 Unspecified lump in the right breast, unspecified quadrant: Secondary | ICD-10-CM | POA: Insufficient documentation

## 2021-04-07 DIAGNOSIS — R1032 Left lower quadrant pain: Secondary | ICD-10-CM | POA: Insufficient documentation

## 2021-04-07 DIAGNOSIS — N6311 Unspecified lump in the right breast, upper outer quadrant: Secondary | ICD-10-CM | POA: Insufficient documentation

## 2021-04-07 DIAGNOSIS — N898 Other specified noninflammatory disorders of vagina: Secondary | ICD-10-CM | POA: Diagnosis present

## 2021-04-07 DIAGNOSIS — N63 Unspecified lump in unspecified breast: Secondary | ICD-10-CM | POA: Insufficient documentation

## 2021-04-07 LAB — POCT URINALYSIS DIP (MANUAL ENTRY)
Bilirubin, UA: NEGATIVE
Blood, UA: NEGATIVE
Glucose, UA: NEGATIVE mg/dL
Ketones, POC UA: NEGATIVE mg/dL
Nitrite, UA: NEGATIVE
Protein Ur, POC: 30 mg/dL — AB
Spec Grav, UA: 1.02 (ref 1.010–1.025)
Urobilinogen, UA: 0.2 E.U./dL
pH, UA: 8.5 — AB (ref 5.0–8.0)

## 2021-04-07 LAB — POCT URINE PREGNANCY: Preg Test, Ur: NEGATIVE

## 2021-04-07 MED ORDER — FAMOTIDINE 20 MG PO TABS: 20.0000 mg | ORAL_TABLET | Freq: Two times a day (BID) | ORAL | 0 refills | Status: DC

## 2021-04-07 MED ORDER — CLINDAMYCIN HCL 300 MG PO CAPS: 300.0000 mg | ORAL_CAPSULE | Freq: Two times a day (BID) | ORAL | 0 refills | Status: DC

## 2021-04-07 MED ORDER — METRONIDAZOLE 500 MG PO TABS: 500.0000 mg | ORAL_TABLET | Freq: Two times a day (BID) | ORAL | 0 refills | Status: DC

## 2021-04-07 NOTE — ED Triage Notes (Signed)
Two day h/o "lump" in her right breast causing pain. Pt reports similar lump on the same breast last year. Pain increases when she runs. No meds taken.  2-3 weeks abdominal pain after eating and drinking.

## 2021-04-07 NOTE — ED Provider Notes (Addendum)
[facility]   MRN: [id number] DOB: 1995/09/21  Subjective:   Victoria Turner is a 25 y.o. female presenting for 3-week history of recurrent epigastric pain after eating, intermittent nausea.  Patient has also had malodorous urine, vaginal discharge, intermittent painful urination.  Also has a mass over the right breast that she would like to have evaluated.  Would like to be checked for STIs.   Current Facility-Administered Medications:    medroxyPROGESTERone (DEPO-PROVERA) injection 150 mg, 150 mg, Intramuscular, Q90 days, Anyanwu, Ugonna A, MD, 150 mg at 09/14/19 1540  Current Outpatient Medications:    emtricitabine-tenofovir AF (DESCOVY) 200-25 MG tablet, Take 1 tablet by mouth daily., Disp: 30 tablet, Rfl: 2   loratadine-pseudoephedrine (CLARITIN-D 12 HOUR) 5-120 MG tablet, Take 1 tablet by mouth 2 (two) times daily., Disp: 30 tablet, Rfl: 0   predniSONE (DELTASONE) 20 MG tablet, Take 2 tablets (40 mg total) by mouth daily., Disp: 10 tablet, Rfl: 0   sertraline (ZOLOFT) 25 MG tablet, Take 1 tablet (25 mg total) by mouth daily., Disp: 30 tablet, Rfl: 5   traMADol (ULTRAM) 50 MG tablet, Take 1 tablet (50 mg total) by mouth every 6 (six) hours as needed for moderate pain., Disp: 30 tablet, Rfl: 0   Allergies  Allergen Reactions   Pollen Extract     Past Medical History:  Diagnosis Date   Allergy    seasonal allergies   Eclampsia 09/02/2018   GERD (gastroesophageal reflux disease)    Gonorrhea 12/29/2019   History of heavy periods    HPV (human papilloma virus) infection    Ileus (HCC) 09/04/2018   Severe preeclampsia, third trimester 09/02/2018     Past Surgical History:  Procedure Laterality Date   CESAREAN SECTION N/A 09/02/2018   Procedure: CESAREAN SECTION;  Surgeon: Willodean Rosenthal, MD;  Location: [facility] LD ORS;  Service: Obstetrics;  Laterality: N/A;    Family History  Problem Relation Age of Onset   Hypertension Mother    Autism  Sister    Autism Brother    Autism Sister    Autism Sister    ADD / ADHD Sister     Social History   Tobacco Use   Smoking status: Some Days   Smokeless tobacco: Never   Tobacco comments:    black and mild 4x week  Vaping Use   Vaping Use: Some days  Substance Use Topics   Alcohol use: Never   Drug use: Not Currently    Types: Marijuana    ROS   Objective:   Vitals: BP 97/73 (BP Location: Right Arm)   Pulse 98   Temp 98.5 F (36.9 C) (Oral)   Resp 18   SpO2 98%   Physical Exam Constitutional:      General: She is not in acute distress.    Appearance: Normal appearance. She is well-developed. She is not ill-appearing, toxic-appearing or diaphoretic.  HENT:     Head: Normocephalic and atraumatic.     Nose: Nose normal.     Mouth/Throat:     Mouth: Mucous membranes are moist.     Pharynx: Oropharynx is clear.  Eyes:     General: No scleral icterus.       Right eye: No discharge.        Left eye: No discharge.     Extraocular Movements: Extraocular movements intact.     Conjunctiva/sclera: Conjunctivae normal.     Pupils: Pupils are equal, round, and reactive to light.  Cardiovascular:  Rate and Rhythm: Normal rate and regular rhythm.     Pulses: Normal pulses.     Heart sounds: Normal heart sounds. No murmur heard.   No friction rub. No gallop.  Pulmonary:     Effort: Pulmonary effort is normal. No respiratory distress.     Breath sounds: Normal breath sounds. No stridor. No wheezing, rhonchi or rales.  Abdominal:     General: Bowel sounds are normal. There is no distension.     Palpations: Abdomen is soft. There is no mass.     Tenderness: There is abdominal tenderness in the left lower quadrant. There is no right CVA tenderness, left CVA tenderness, guarding or rebound. Negative signs include Murphy's sign, Rovsing's sign and McBurney's sign.  Skin:    General: Skin is warm and dry.     Findings: No rash.  Neurological:     General: No focal  deficit present.     Mental Status: She is alert and oriented to person, place, and time.  Psychiatric:        Mood and Affect: Mood normal.        Behavior: Behavior normal.        Thought Content: Thought content normal.        Judgment: Judgment normal.    Results for orders placed or performed during the hospital encounter of 04/07/21 (from the past 24 hour(s))  POCT urine pregnancy     Status: None   Collection Time: 04/07/21  3:03 PM  Result Value Ref Range   Preg Test, Ur Negative Negative  POCT urinalysis dipstick     Status: Abnormal   Collection Time: 04/07/21  3:09 PM  Result Value Ref Range   Color, UA yellow yellow   Clarity, UA cloudy (A) clear   Glucose, UA negative negative mg/dL   Bilirubin, UA negative negative   Ketones, POC UA negative negative mg/dL   Spec Grav, UA 6.578 4.696 - 1.025   Blood, UA negative negative   pH, UA 8.5 (A) 5.0 - 8.0   Protein Ur, POC =30 (A) negative mg/dL   Urobilinogen, UA 0.2 0.2 or 1.0 E.U./dL   Nitrite, UA Negative Negative   Leukocytes, UA Small (1+) (A) Negative     Assessment and Plan :   PDMP not reviewed this encounter.  1. Breast mass in female   2. Mass of upper outer quadrant of right breast   3. Vaginal discharge   4. Abdominal pain, left lower quadrant     Low suspicion for an acute abdomen.  It is possible that patient has PID but overall her physical exam is unremarkable.  Patient has a history of bacterial vaginosis.  In April she was treated for trichomoniasis.  Will cover her for BV again.  Recommended restarting her famotidine for acid reflux.  Placed an order for a diagnostic mammogram and ultrasound of the breast.  This is pending.  Counseled patient on potential for adverse effects with medications prescribed/recommended today, ER and return-to-clinic precautions discussed, patient verbalized understanding.    Wallis Bamberg, PA-C 04/07/21 1518   Patient requested visit for medications and  metronidazole.  Will use clindamycin.   Wallis Bamberg, PA-C 04/07/21 1529

## 2021-04-09 ENCOUNTER — Telehealth (HOSPITAL_COMMUNITY): Payer: Self-pay | Admitting: Emergency Medicine

## 2021-04-09 LAB — CERVICOVAGINAL ANCILLARY ONLY
Bacterial Vaginitis (gardnerella): POSITIVE — AB
Candida Glabrata: NEGATIVE
Candida Vaginitis: POSITIVE — AB
Chlamydia: NEGATIVE
Comment: NEGATIVE
Comment: NEGATIVE
Comment: NEGATIVE
Comment: NEGATIVE
Comment: NEGATIVE
Comment: NORMAL
Neisseria Gonorrhea: NEGATIVE
Trichomonas: NEGATIVE

## 2021-04-09 MED ORDER — CLINDAMYCIN HCL 300 MG PO CAPS: 300.0000 mg | ORAL_CAPSULE | Freq: Two times a day (BID) | ORAL | 0 refills | Status: DC

## 2021-04-09 MED ORDER — FLUCONAZOLE 150 MG PO TABS: 150.0000 mg | ORAL_TABLET | Freq: Once | ORAL | 0 refills | Status: AC

## 2021-04-09 MED ORDER — FAMOTIDINE 20 MG PO TABS: 20.0000 mg | ORAL_TABLET | Freq: Two times a day (BID) | ORAL | 0 refills | Status: DC

## 2021-04-09 MED ORDER — FLUCONAZOLE 150 MG PO TABS
150.0000 mg | ORAL_TABLET | Freq: Once | ORAL | 0 refills | Status: DC
Start: 2021-04-09 — End: 2021-04-09

## 2021-04-09 NOTE — Telephone Encounter (Signed)
Patient wanted all prescriptions resent to the [facility] on [address], not the [facility].

## 2021-04-10 ENCOUNTER — Telehealth: Payer: Self-pay | Admitting: Emergency Medicine

## 2021-04-10 LAB — URINE CULTURE: Culture: >=100000 — AB

## 2021-04-10 MED ORDER — NITROFURANTOIN MONOHYD MACRO 100 MG PO CAPS: 100.0000 mg | ORAL_CAPSULE | Freq: Two times a day (BID) | ORAL | 0 refills | Status: DC

## 2021-04-26 ENCOUNTER — Ambulatory Visit
Admission: RE | Admit: 2021-04-26 | Discharge: 2021-04-26 | Disposition: A | Discharge status: Home / Self Care | Payer: Medicaid Other | Source: Ambulatory Visit | Attending: Urgent Care | Admitting: Urgent Care

## 2021-04-26 ENCOUNTER — Other Ambulatory Visit: Payer: Self-pay

## 2021-04-26 DIAGNOSIS — N6310 Unspecified lump in the right breast, unspecified quadrant: Secondary | ICD-10-CM

## 2021-06-08 ENCOUNTER — Encounter (HOSPITAL_COMMUNITY): Payer: Self-pay

## 2021-06-08 ENCOUNTER — Ambulatory Visit (HOSPITAL_COMMUNITY)
Admission: EM | Admit: 2021-06-08 | Discharge: 2021-06-08 | Disposition: A | Discharge status: Home / Self Care | Payer: Medicaid Other | Attending: Physician Assistant | Admitting: Physician Assistant

## 2021-06-08 ENCOUNTER — Other Ambulatory Visit: Payer: Self-pay

## 2021-06-08 DIAGNOSIS — J101 Influenza due to other identified influenza virus with other respiratory manifestations: Secondary | ICD-10-CM

## 2021-06-08 DIAGNOSIS — R52 Pain, unspecified: Secondary | ICD-10-CM

## 2021-06-08 LAB — POC INFLUENZA A AND B ANTIGEN (URGENT CARE ONLY)
INFLUENZA A ANTIGEN, POC: POSITIVE — AB
INFLUENZA B ANTIGEN, POC: NEGATIVE

## 2021-06-08 MED ORDER — OSELTAMIVIR PHOSPHATE 75 MG PO CAPS: 75.0000 mg | ORAL_CAPSULE | Freq: Two times a day (BID) | ORAL | 0 refills | Status: DC

## 2021-06-08 MED ORDER — PROMETHAZINE-DM 6.25-15 MG/5ML PO SYRP: 5.0000 mL | ORAL_SOLUTION | Freq: Three times a day (TID) | ORAL | 0 refills | Status: DC | PRN

## 2021-06-08 NOTE — ED Provider Notes (Signed)
[place]    CSN: [id number] Arrival date & time: 06/08/21  1730      History   Chief Complaint Chief Complaint  Patient presents with   Sore Throat   Generalized Body Aches    HPI Victoria Turner is a 25 y.o. female.   Patient presents today with a 2-day history of URI symptoms including nasal congestion, sore throat, cough, body aches.  She is unsure if she had a fever but has had a subjective sensation of feeling hot.  Denies any chest pain, shortness of breath, nausea, vomiting, diarrhea.  She has tried over-the-counter medications including Tylenol ibuprofen without improvement of symptoms.  She has a history of allergies but denies history of asthma, COPD, smoking.  Denies any recent antibiotic use.  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  She has not had influenza vaccine but has had COVID-19 vaccinations including booster.  She is having difficulty with daily activities as result of symptoms.   Past Medical History:  Diagnosis Date   Allergy    seasonal allergies   Eclampsia 09/02/2018   GERD (gastroesophageal reflux disease)    Gonorrhea 12/29/2019   History of heavy periods    HPV (human papilloma virus) infection    Ileus ([place]) 09/04/2018   Severe preeclampsia, third trimester 09/02/2018    Patient Active Problem List   Diagnosis Date Noted   Breast lump on right side at 12 o'clock position 05/04/2020   Vaginal discharge 05/04/2020   Menorrhagia 12/29/2019   Seizures ([place]) 12/29/2019   Depression 12/29/2019   High risk sexual behavior 08/15/2015   Other seasonal allergic rhinitis 01/12/2014    Past Surgical History:  Procedure Laterality Date   CESAREAN SECTION N/A 09/02/2018   Procedure: CESAREAN SECTION;  Surgeon: Lavonia Drafts, MD;  Location: [facility] LD ORS;  Service: Obstetrics;  Laterality: N/A;    OB History     Gravida  1   Para  1   Term  0   Preterm  1   AB  0    Living  1      SAB  0   IAB  0   Ectopic  0   Multiple      Live Births  1            Home Medications    Prior to Admission medications   Medication Sig Start Date End Date Taking? Authorizing Provider  oseltamivir (TAMIFLU) 75 MG capsule Take 1 capsule (75 mg total) by mouth every 12 (twelve) hours. 06/08/21  Yes Emanie Behan K, PA-C  promethazine-dextromethorphan (PROMETHAZINE-DM) 6.25-15 MG/5ML syrup Take 5 mLs by mouth 3 (three) times daily as needed for cough. 06/08/21  Yes Celese Banner K, PA-C  clindamycin (CLEOCIN) 300 MG capsule Take 1 capsule (300 mg total) by mouth 2 (two) times daily. 04/09/21   Chase Picket, MD  emtricitabine-tenofovir AF (DESCOVY) 200-25 MG tablet Take 1 tablet by mouth daily. 10/10/20   Harvie Heck, MD  famotidine (PEPCID) 20 MG tablet Take 1 tablet (20 mg total) by mouth 2 (two) times daily. 04/09/21   Lamptey, Myrene Galas, MD  loratadine-pseudoephedrine (CLARITIN-D 12 HOUR) 5-120 MG tablet Take 1 tablet by mouth 2 (two) times daily. 11/18/19   Raylene Everts, MD  nitrofurantoin, macrocrystal-monohydrate, (MACROBID) 100 MG capsule Take 1 capsule (100 mg total) by mouth 2 (two) times daily. 04/10/21   Lamptey, Myrene Galas, MD  predniSONE (DELTASONE) 20  MG tablet Take 2 tablets (40 mg total) by mouth daily. 12/13/20   Mardella Layman, MD  sertraline (ZOLOFT) 25 MG tablet Take 1 tablet (25 mg total) by mouth daily. 12/29/19   Claudean Severance, MD  traMADol (ULTRAM) 50 MG tablet Take 1 tablet (50 mg total) by mouth every 6 (six) hours as needed for moderate pain. 08/15/19   Anyanwu, Jethro Bastos, MD  norethindrone (MICRONOR,CAMILA,ERRIN) 0.35 MG tablet Take 1 tablet (0.35 mg total) by mouth daily. Patient not taking: Reported on 08/10/2019 10/06/18 11/18/19  Conan Bowens, MD    Family History Family History  Problem Relation Age of Onset   Hypertension Mother    Autism Sister    Autism Brother    Autism Sister    Autism Sister    ADD / ADHD Sister      Social History Social History   Tobacco Use   Smoking status: Some Days   Smokeless tobacco: Never   Tobacco comments:    black and mild 4x week  Vaping Use   Vaping Use: Some days  Substance Use Topics   Alcohol use: Never   Drug use: Not Currently    Types: Marijuana     Allergies   Pollen extract   Review of Systems Review of Systems  Constitutional:  Positive for activity change, fatigue and fever (subjective). Negative for appetite change.  HENT:  Positive for congestion and sore throat. Negative for sinus pressure and sneezing.   Respiratory:  Positive for cough. Negative for shortness of breath.   Cardiovascular:  Negative for chest pain.  Gastrointestinal:  Negative for abdominal pain, diarrhea, nausea and vomiting.  Musculoskeletal:  Positive for arthralgias and myalgias.  Neurological:  Positive for headaches. Negative for dizziness and light-headedness.    Physical Exam Triage Vital Signs ED Triage Vitals  Enc Vitals Group     BP 06/08/21 1810 115/82     Pulse Rate 06/08/21 1810 89     Resp 06/08/21 1810 17     Temp 06/08/21 1810 98.9 F (37.2 C)     Temp Source 06/08/21 1810 Oral     SpO2 06/08/21 1810 100 %     Weight --      Height --      Head Circumference --      Peak Flow --      Pain Score 06/08/21 1809 8     Pain Loc --      Pain Edu? --      Excl. in GC? --    No data found.  Updated Vital Signs BP 115/82 (BP Location: Right Arm)   Pulse 89   Temp 98.9 F (37.2 C) (Oral)   Resp 17   LMP 05/04/2021 (Approximate)   SpO2 100%   Visual Acuity Right Eye Distance:   Left Eye Distance:   Bilateral Distance:    Right Eye Near:   Left Eye Near:    Bilateral Near:     Physical Exam Vitals reviewed.  Constitutional:      General: She is awake. She is not in acute distress.    Appearance: Normal appearance. She is well-developed. She is not ill-appearing.     Comments: Very pleasant female appears stated age in no acute  distress laying on exam room table  HENT:     Head: Normocephalic and atraumatic.     Right Ear: Tympanic membrane, ear canal and external ear normal. Tympanic membrane is not erythematous or bulging.  Left Ear: Tympanic membrane, ear canal and external ear normal. Tympanic membrane is not erythematous or bulging.     Nose:     Right Sinus: No maxillary sinus tenderness or frontal sinus tenderness.     Left Sinus: No maxillary sinus tenderness or frontal sinus tenderness.     Mouth/Throat:     Pharynx: Uvula midline. Posterior oropharyngeal erythema present. No oropharyngeal exudate.  Cardiovascular:     Rate and Rhythm: Normal rate and regular rhythm.     Heart sounds: Normal heart sounds, S1 normal and S2 normal. No murmur heard. Pulmonary:     Effort: Pulmonary effort is normal.     Breath sounds: Normal breath sounds. No wheezing, rhonchi or rales.     Comments: Clear to auscultation bilaterally Psychiatric:        Behavior: Behavior is cooperative.     UC Treatments / Results  Labs (all labs ordered are listed, but only abnormal results are displayed) Labs Reviewed  POC INFLUENZA A AND B ANTIGEN (URGENT CARE ONLY) - Abnormal; Notable for the following components:      Result Value   INFLUENZA A ANTIGEN, POC POSITIVE (*)    All other components within normal limits    EKG   Radiology No results found.  Procedures Procedures (including critical care time)  Medications Ordered in UC Medications - No data to display  Initial Impression / Assessment and Plan / UC Course  I have reviewed the triage vital signs and the nursing notes.  Pertinent labs & imaging results that were available during my care of the patient were reviewed by me and considered in my medical decision making (see chart for details).     Discussed likely viral etiology given short duration of symptoms.  No evidence of acute infection that would warrant initiation of antibiotics today.  Patient  does have positive for influenza a.  She is within 72 hours of symptom onset so we will start Tamiflu.  She was prescribed promethazine DM for cough with instruction not to drive or drink alcohol with this medication as drowsiness is a common side effect.  Recommended she use over-the-counter medications including Tylenol and ibuprofen for fever and pain.  She is to rest and drink plenty of fluid.  She was provided work excuse note.  Discussed alarm symptoms that warrant emergent evaluation.  Strict return precautions given to which she expressed understanding.  Final Clinical Impressions(s) / UC Diagnoses   Final diagnoses:  Influenza A  Body aches     Discharge Instructions      You tested positive for influenza.  Please start Tamiflu twice daily.  Use Promethazine DM for cough.  This make you sleepy do not drive or drink alcohol while taking it.  Use Tylenol ibuprofen for fever and pain.  Make sure you rest and drink plenty of fluid.  If you have any persistent or worsening symptoms you need to be reevaluated.  If you develop any severe symptoms including high fever not responding to medication, nausea/vomiting interfering with oral intake, chest pain, shortness of breath you need to go to the emergency room.    ED Prescriptions     Medication Sig Dispense Auth. Provider   oseltamivir (TAMIFLU) 75 MG capsule Take 1 capsule (75 mg total) by mouth every 12 (twelve) hours. 10 capsule Edwardine Deschepper K, PA-C   promethazine-dextromethorphan (PROMETHAZINE-DM) 6.25-15 MG/5ML syrup Take 5 mLs by mouth 3 (three) times daily as needed for cough. 118 mL Donya Tomaro K,  PA-C      PDMP not reviewed this encounter.   Terrilee Croak, PA-C 06/08/21 1854

## 2021-06-08 NOTE — Discharge Instructions (Signed)
You tested positive for influenza.  Please start Tamiflu twice daily.  Use Promethazine DM for cough.  This make you sleepy do not drive or drink alcohol while taking it.  Use Tylenol ibuprofen for fever and pain.  Make sure you rest and drink plenty of fluid.  If you have any persistent or worsening symptoms you need to be reevaluated.  If you develop any severe symptoms including high fever not responding to medication, nausea/vomiting interfering with oral intake, chest pain, shortness of breath you need to go to the emergency room.

## 2021-06-08 NOTE — ED Triage Notes (Signed)
Pt presents with body aches, rib cage pain and nasal pain. Pt states her throat has been hurting and has been coughing up blood.

## 2021-07-24 IMAGING — CT CT ABD-PELV W/ CM
2 of 4 series · 16 of 46 positions shown, 18 images · IV contrast (Omni 300)
Comparison: CT abdomen pelvis dated 10/15/2011.

CLINICAL DATA: 23-year-old female with right lower quadrant
abdominal pain.

EXAM:
CT ABDOMEN AND PELVIS WITH CONTRAST
TECHNIQUE: Multidetector CT imaging of the abdomen and pelvis was performed
using the standard protocol following bolus administration of
intravenous contrast.
CONTRAST:  80mL OMNIPAQUE IOHEXOL 300 MG/ML  SOLN

[Series 2: a/p w/ 5mm · axial · 0.66mm/px · z∈[+864,+1198]mm · 13 of 73 slices shown, 15 images]
[im 3/73  soft-tissue]
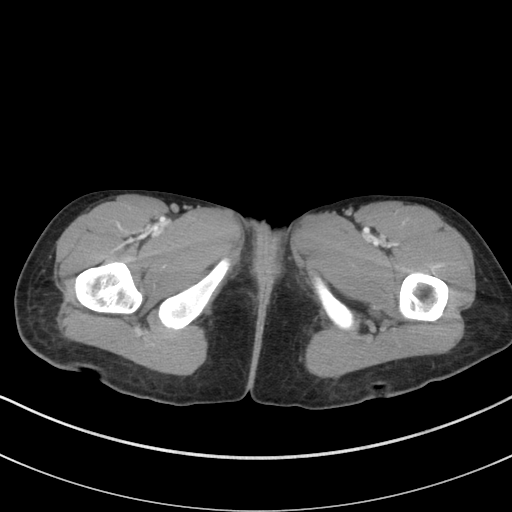
[im 3/73  bone]
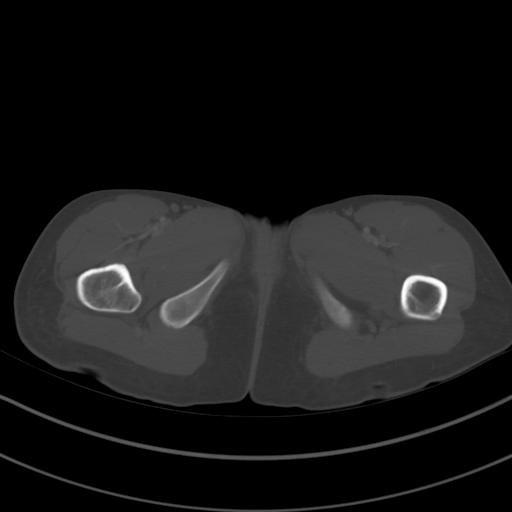
[im 9/73  soft-tissue]
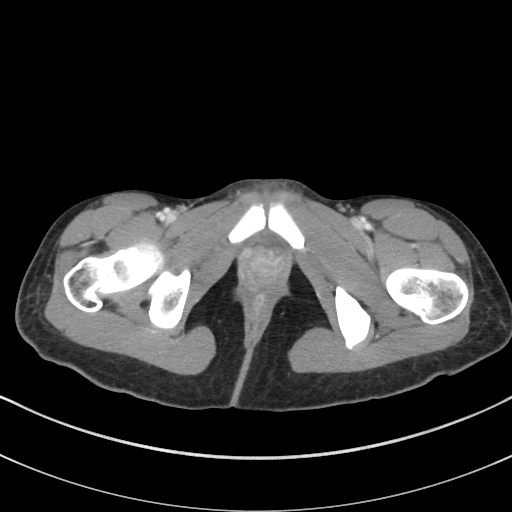
[im 14/73  soft-tissue]
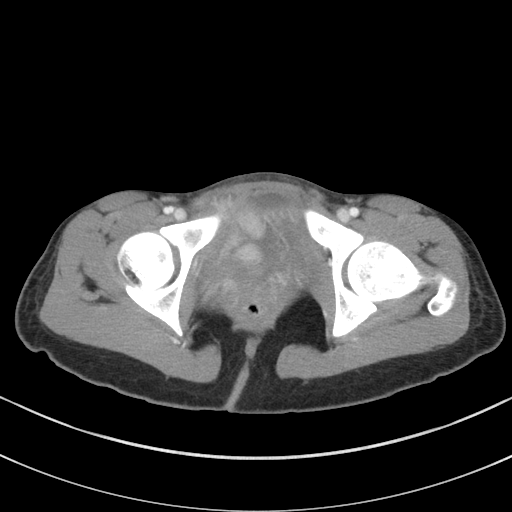
[im 20/73  soft-tissue]
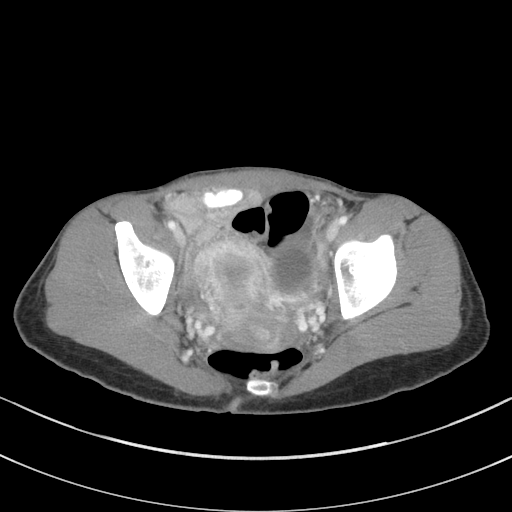
[im 25/73  soft-tissue]
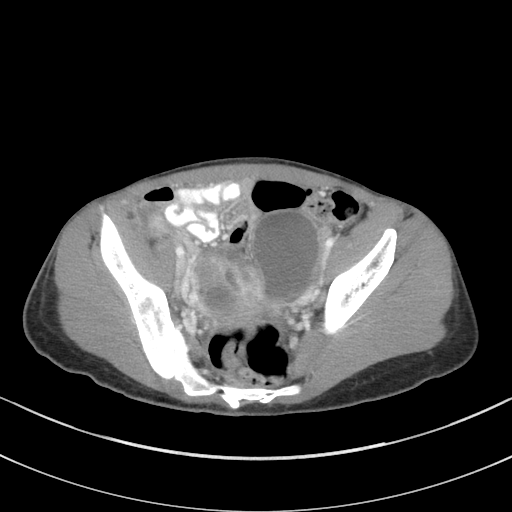
[im 31/73  soft-tissue]
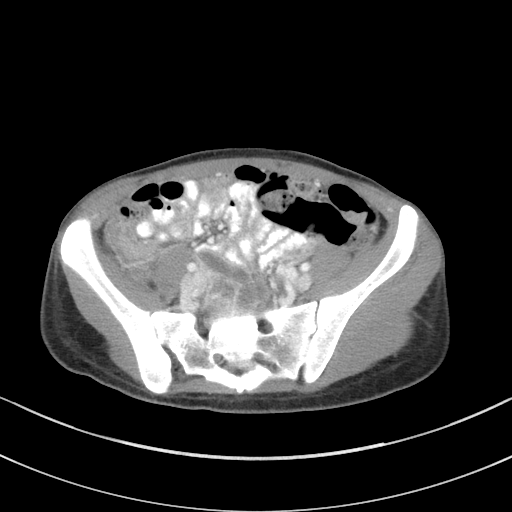
[im 37/73  soft-tissue]
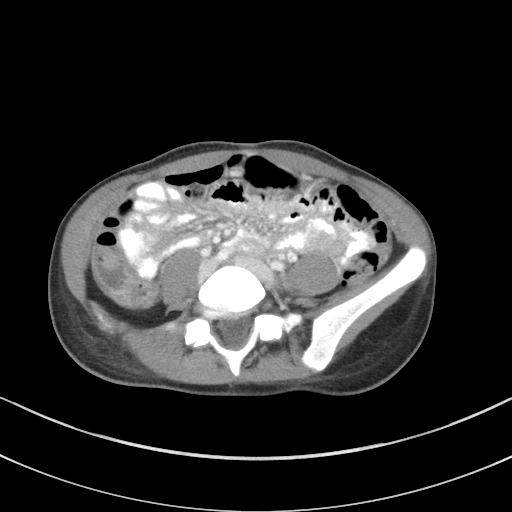
[im 42/73  soft-tissue]
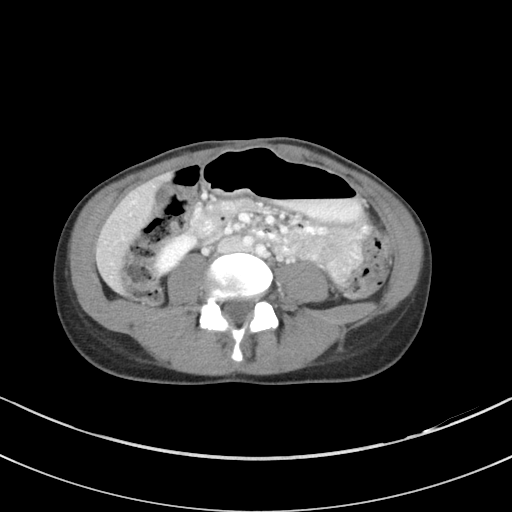
[im 48/73  soft-tissue]
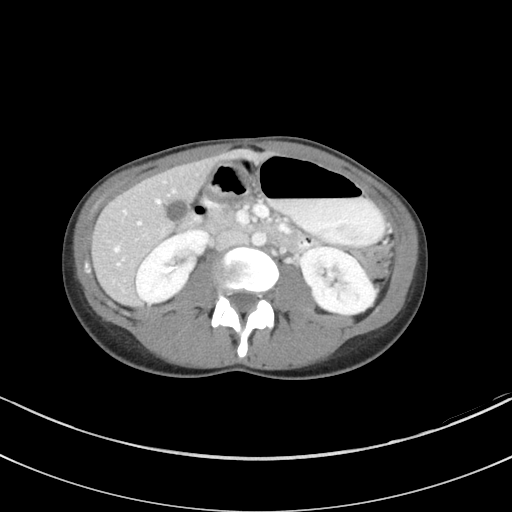
[im 48/73  bone]
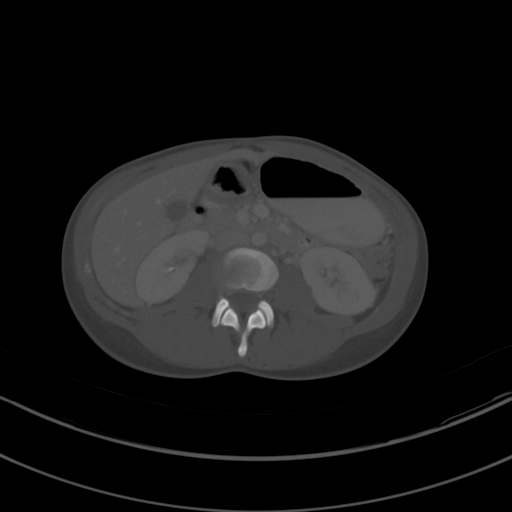
[im 53/73  soft-tissue]
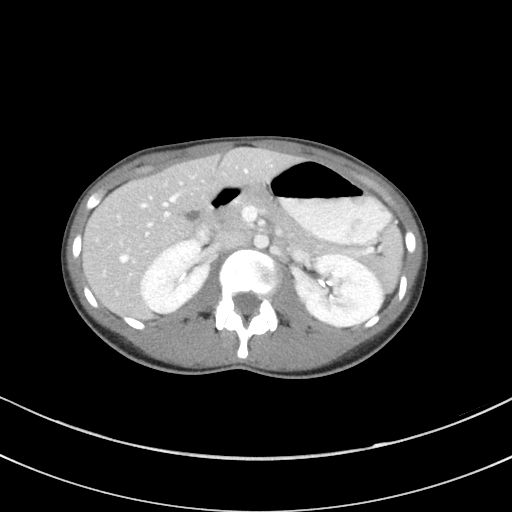
[im 59/73  soft-tissue]
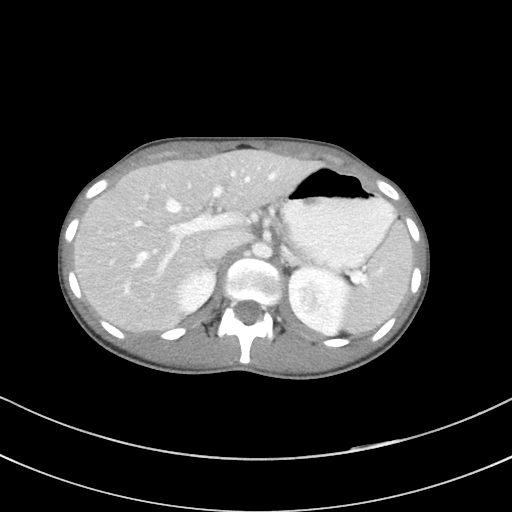
[im 64/73  soft-tissue]
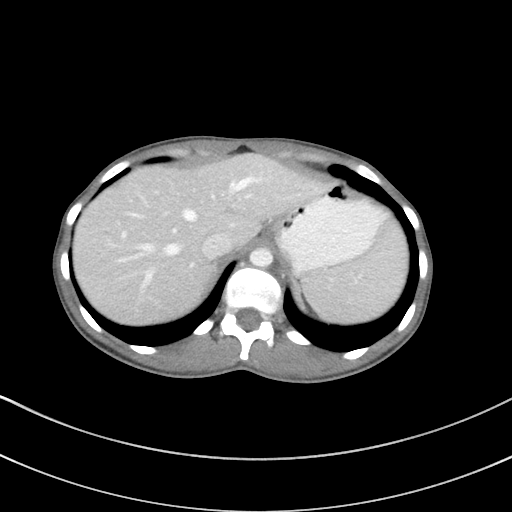
[im 70/73  soft-tissue]
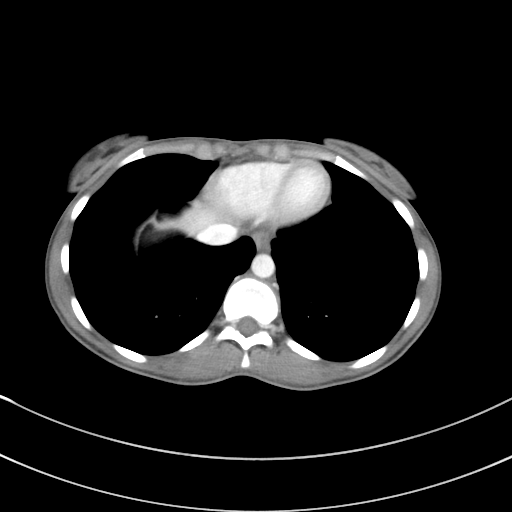

[Series 5: a/p w/ cor · coronal · 0.60mm/px · 3 of 100 slices shown]
[im 34/100  soft-tissue]
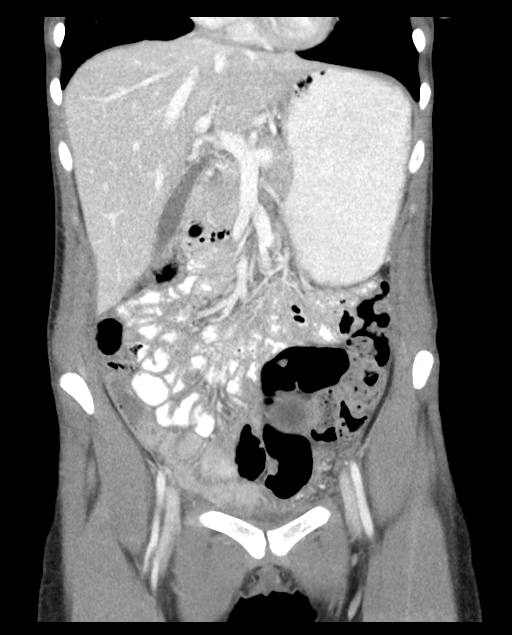
[im 45/100  soft-tissue]
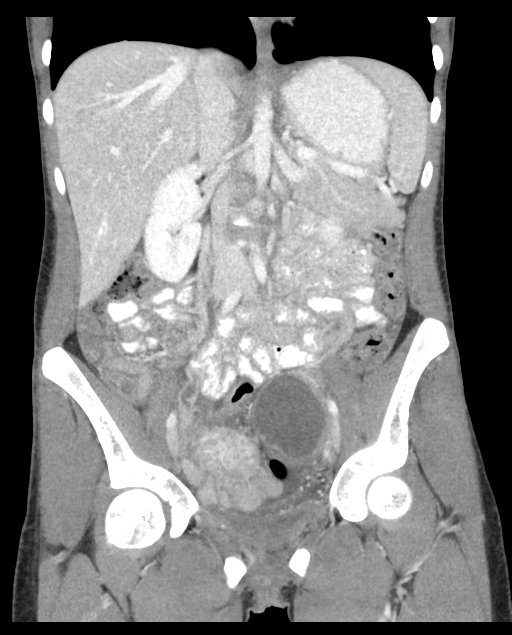
[im 56/100  soft-tissue]
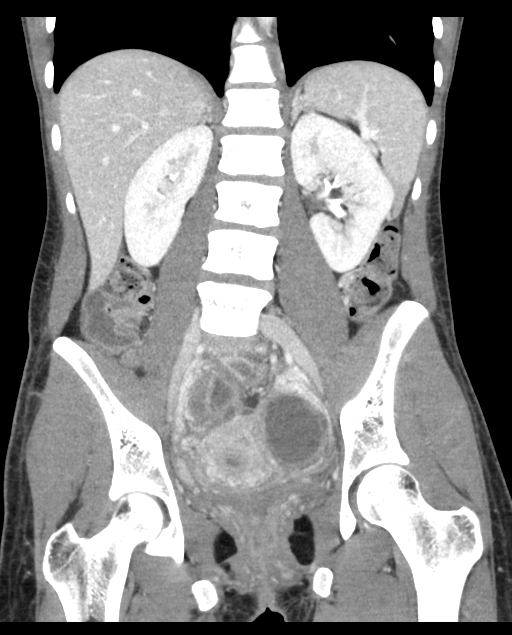

[16 of 46 positions shown; findings below may reference images not displayed]

FINDINGS: Lower chest: The visualized lung bases are clear.

No intra-abdominal free air. Probable small free fluid in the
pelvis.

Hepatobiliary: No focal liver abnormality is seen. No gallstones,
gallbladder wall thickening, or biliary dilatation.

Pancreas: Unremarkable. No pancreatic ductal dilatation or
surrounding inflammatory changes.

Spleen: Normal in size without focal abnormality.

Adrenals/Urinary Tract: The adrenal glands, kidneys, visualized
ureters appear unremarkable. The urinary bladder is collapsed.

Stomach/Bowel: There is no bowel obstruction. Mildly thickened
appearance of the small bowel loops, likely related to
underdistention or reactive to inflammatory changes of the pelvis.
The appendix is suboptimally visualized. A small pocket of air
within a tubular appearing structure in the right lower quadrant
(series 2, image 41 and coronal series 5, image 55) likely
represents air within a normal appendix.

Vascular/Lymphatic: The abdominal aorta and IVC are unremarkable. No
portal venous gas. No adenopathy.

Reproductive: The uterus is anteverted. There is a 5.8 x 4.1 cm
cystic structure in the left adnexa concerning for an infected
collection/abscess or infected ovarian cyst/follicle. Dilated
fluid-filled tubular structures noted in the right adnexa. There is
a cluster of multiloculated/multi cystic structure in the right
adnexa measuring approximately 4.4 x 3.1 cm. Findings likely
represent dilated fallopian tubes with inflammatory/infectious
changes.

Other: None

Musculoskeletal: No acute or significant osseous findings.
IMPRESSION: Constellation of findings concerning for pelvic inflammatory disease
or tubo-ovarian abscess. Clinical correlation recommended. Pelvic
ultrasound may provide better evaluation.

## 2021-10-17 ENCOUNTER — Encounter (HOSPITAL_COMMUNITY): Payer: Self-pay

## 2021-10-17 ENCOUNTER — Other Ambulatory Visit: Payer: Self-pay

## 2021-10-17 ENCOUNTER — Emergency Department (HOSPITAL_COMMUNITY)
Admission: EM | Admit: 2021-10-17 | Discharge: 2021-10-17 | Disposition: A | Discharge status: Home / Self Care | Payer: Medicaid Other | Attending: Emergency Medicine | Admitting: Emergency Medicine

## 2021-10-17 ENCOUNTER — Emergency Department (HOSPITAL_COMMUNITY): Payer: Medicaid Other

## 2021-10-17 DIAGNOSIS — T148XXA Other injury of unspecified body region, initial encounter: Secondary | ICD-10-CM

## 2021-10-17 DIAGNOSIS — W268XXA Contact with other sharp object(s), not elsewhere classified, initial encounter: Secondary | ICD-10-CM | POA: Insufficient documentation

## 2021-10-17 DIAGNOSIS — S91331A Puncture wound without foreign body, right foot, initial encounter: Secondary | ICD-10-CM | POA: Diagnosis not present

## 2021-10-17 DIAGNOSIS — S99921A Unspecified injury of right foot, initial encounter: Secondary | ICD-10-CM | POA: Diagnosis present

## 2021-10-17 MED ORDER — IBUPROFEN 800 MG PO TABS
800.0000 mg | ORAL_TABLET | Freq: Once | ORAL | Status: AC
  Administered 2021-10-17: 800 mg via ORAL
  Filled 2021-10-17: qty 1

## 2021-10-17 NOTE — Discharge Instructions (Signed)
Can do warm soaks at home a few times a day. ?Take tylenol or motrin as needed for pain.   ?Follow-up with the foot care center if still having ongoing issues. ?Return here for new concerns. ?

## 2021-10-17 NOTE — ED Provider Notes (Signed)
?[facility] EMERGENCY DEPARTMENT ?Provider Note ? ? ?CSN: [id number] ?Arrival date & time: 10/17/21  0113 ? ?  ? ?History ? ?Chief Complaint  ?Patient presents with  ? Foot Pain  ? ? ?Victoria Turner is a 26 y.o. female. ? ?The history is provided by the patient and medical records.  ?Foot Pain ? ?26 y.o. F presenting to the ED with right foot pain.  States he stepped off the bed this morning and felt something poke into the foot.  She states she is not sure what it is.  States all day long she has felt like there is something still in her foot but cannot see it.  She is not diabetic.  Tetanus UTD (2021).   ? ?Home Medications ?Prior to Admission medications   ?Medication Sig Start Date End Date Taking? Authorizing Provider  ?clindamycin (CLEOCIN) 300 MG capsule Take 1 capsule (300 mg total) by mouth 2 (two) times daily. 04/09/21   LampteyBritta Mccreedy, MD  ?emtricitabine-tenofovir AF (DESCOVY) 200-25 MG tablet Take 1 tablet by mouth daily. 10/10/20   Eliezer Bottom, MD  ?famotidine (PEPCID) 20 MG tablet Take 1 tablet (20 mg total) by mouth 2 (two) times daily. 04/09/21   LampteyBritta Mccreedy, MD  ?loratadine-pseudoephedrine (CLARITIN-D 12 HOUR) 5-120 MG tablet Take 1 tablet by mouth 2 (two) times daily. 11/18/19   Eustace Moore, MD  ?nitrofurantoin, macrocrystal-monohydrate, (MACROBID) 100 MG capsule Take 1 capsule (100 mg total) by mouth 2 (two) times daily. 04/10/21   LampteyBritta Mccreedy, MD  ?oseltamivir (TAMIFLU) 75 MG capsule Take 1 capsule (75 mg total) by mouth every 12 (twelve) hours. 06/08/21   Raspet, Noberto Retort, PA-C  ?predniSONE (DELTASONE) 20 MG tablet Take 2 tablets (40 mg total) by mouth daily. 12/13/20   Mardella Layman, MD  ?promethazine-dextromethorphan (PROMETHAZINE-DM) 6.25-15 MG/5ML syrup Take 5 mLs by mouth 3 (three) times daily as needed for cough. 06/08/21   Raspet, Noberto Retort, PA-C  ?sertraline (ZOLOFT) 25 MG tablet Take 1 tablet (25 mg total) by mouth daily. 12/29/19   Claudean Severance, MD  ?traMADol (ULTRAM) 50 MG tablet Take 1 tablet (50 mg total) by mouth every 6 (six) hours as needed for moderate pain. 08/15/19   Anyanwu, Jethro Bastos, MD  ?norethindrone (MICRONOR,CAMILA,ERRIN) 0.35 MG tablet Take 1 tablet (0.35 mg total) by mouth daily. ?Patient not taking: Reported on 08/10/2019 10/06/18 11/18/19  Conan Bowens, MD  ?   ? ?Allergies    ?Pollen extract   ? ?Review of Systems   ?Review of Systems  ?Skin:  Positive for wound.  ?All other systems reviewed and are negative. ? ?Physical Exam ?Updated Vital Signs ?BP 97/82 (BP Location: Right Arm)   Pulse 75   Temp 98.6 ?F (37 ?C) (Oral)   Resp 17   Ht 5\' 2"  (1.575 m)   Wt 45.8 kg   LMP 09/18/2021 (Approximate)   SpO2 100%   BMI 18.47 kg/m?  ?Physical Exam ?Vitals and nursing note reviewed.  ?Constitutional:   ?   Appearance: She is well-developed.  ?HENT:  ?   Head: Normocephalic and atraumatic.  ?Eyes:  ?   Conjunctiva/sclera: Conjunctivae normal.  ?   Pupils: Pupils are equal, round, and reactive to light.  ?Cardiovascular:  ?   Rate and Rhythm: Normal rate and regular rhythm.  ?   Heart sounds: Normal heart sounds.  ?Pulmonary:  ?   Effort: Pulmonary effort is normal.  ?   Breath sounds: Normal  breath sounds.  ?Abdominal:  ?   General: Bowel sounds are normal.  ?   Palpations: Abdomen is soft.  ?Musculoskeletal:     ?   General: Normal range of motion.  ?   Cervical back: Normal range of motion.  ?Skin: ?   General: Skin is warm and dry.  ?Neurological:  ?   Mental Status: She is alert and oriented to person, place, and time.  ? ? ?ED Results / Procedures / Treatments   ?Labs ?(all labs ordered are listed, but only abnormal results are displayed) ?Labs Reviewed - No data to display ? ?EKG ?None ? ?Radiology ?DG Foot Complete Right ? ?Result Date: 10/17/2021 ?CLINICAL DATA:  Heel pain following stepping on sharp object, evaluate for persistent foreign body. EXAM: RIGHT FOOT COMPLETE - 3+ VIEW COMPARISON:  None. FINDINGS: No acute fracture or  dislocation is noted. No soft tissue abnormality is seen. No discrete radiopaque foreign body is noted. IMPRESSION: No acute abnormality noted.  No foreign body identified. Electronically Signed   By: Alcide Clever M.D.   On: 10/17/2021 02:24   ? ?Procedures ?Procedures  ? ? ?Medications Ordered in ED ?Medications  ?ibuprofen (ADVIL) tablet 800 mg (800 mg Oral Given 10/17/21 0505)  ? ? ?ED Course/ Medical Decision Making/ A&P ?  ?                        ?Medical Decision Making ?Risk ?Prescription drug management. ? ? ?26 y.o. F here with puncture wound to right foot.  I do not appreciate any retained body on my exam, x-ray is also negative.  Her tetanus is up-to-date.  Discussed home wound care, given referral to podiatry if needed.  Can return here for new or acute changes. ? ?Final Clinical Impression(s) / ED Diagnoses ?Final diagnoses:  ?Puncture wound  ? ? ?Rx / DC Orders ?ED Discharge Orders   ? ? None  ? ?  ? ? ?  ?Garlon Hatchet, PA-C ?10/18/21 0038 ? ?  ?Sabas Sous, MD ?10/18/21 [phone] ? ?

## 2021-10-17 NOTE — ED Provider Triage Note (Signed)
?  Emergency Medicine Provider Triage Evaluation Note ? ?MRN:  [id number]  ?Arrival date & time: 10/17/21    ?Medically screening exam initiated at 1:52 AM.   ?CC:   ?Foot Pain ?  ?HPI:  ?Victoria Turner is a 26 y.o. year-old female presents to the ED with chief complaint of right heel pain.  She states that she stepped on something, but is uncertain what it was.  She got some of it out, but still feels like there is retained foreign body.  Pain is worsened with ambulation. ? ?History provided by  ?ROS:  ?-As included in HPI ?PE:  ? ?Vitals:  ? 10/17/21 0124 10/17/21 0134  ?BP: 102/72   ?Pulse: 85   ?Resp: 16   ?Temp: 97.9 ?F (36.6 ?C)   ?SpO2: 99% 99%  ?  ?Non-toxic appearing ?No respiratory distress ?No visible foreign body to the right foot ?MDM:  ?Based on signs and symptoms, FB in foot is highest on my differential. ?I've ordered x-ray in triage to expedite lab/diagnostic workup. ? ?Discussed with patient that x-ray is negative, she will likely need to be referred to podiatry. ? ?Patient was informed that the remainder of the evaluation will be completed by another provider, this initial triage assessment does not replace that evaluation, and the importance of remaining in the ED until their evaluation is complete. ? ?  ?Roxy Horseman, PA-C ?10/17/21 0154 ? ?

## 2021-10-17 NOTE — ED Triage Notes (Signed)
Complaining of pain in the foot that started this am, stepped on something with the right heel and it is still in there ?

## 2021-11-11 ENCOUNTER — Emergency Department (HOSPITAL_COMMUNITY): Payer: Medicaid Other

## 2021-11-11 ENCOUNTER — Encounter (HOSPITAL_COMMUNITY): Payer: Self-pay

## 2021-11-11 ENCOUNTER — Other Ambulatory Visit (HOSPITAL_COMMUNITY): Payer: Self-pay

## 2021-11-11 ENCOUNTER — Other Ambulatory Visit: Payer: Self-pay

## 2021-11-11 ENCOUNTER — Emergency Department (HOSPITAL_COMMUNITY)
Admission: EM | Admit: 2021-11-11 | Discharge: 2021-11-11 | Disposition: A | Discharge status: Home / Self Care | Payer: Medicaid Other | Attending: Emergency Medicine | Admitting: Emergency Medicine

## 2021-11-11 DIAGNOSIS — S60221A Contusion of right hand, initial encounter: Secondary | ICD-10-CM | POA: Diagnosis not present

## 2021-11-11 DIAGNOSIS — S6991XA Unspecified injury of right wrist, hand and finger(s), initial encounter: Secondary | ICD-10-CM | POA: Diagnosis present

## 2021-11-11 DIAGNOSIS — M79641 Pain in right hand: Secondary | ICD-10-CM | POA: Diagnosis not present

## 2021-11-11 DIAGNOSIS — Z23 Encounter for immunization: Secondary | ICD-10-CM | POA: Diagnosis not present

## 2021-11-11 DIAGNOSIS — W228XXA Striking against or struck by other objects, initial encounter: Secondary | ICD-10-CM | POA: Insufficient documentation

## 2021-11-11 DIAGNOSIS — F17210 Nicotine dependence, cigarettes, uncomplicated: Secondary | ICD-10-CM | POA: Insufficient documentation

## 2021-11-11 DIAGNOSIS — S60419A Abrasion of unspecified finger, initial encounter: Secondary | ICD-10-CM

## 2021-11-11 MED ORDER — TETANUS-DIPHTH-ACELL PERTUSSIS 5-2.5-18.5 LF-MCG/0.5 IM SUSY
0.5000 mL | PREFILLED_SYRINGE | Freq: Once | INTRAMUSCULAR | Status: AC
  Administered 2021-11-11: 0.5 mL via INTRAMUSCULAR
  Filled 2021-11-11: qty 0.5

## 2021-11-11 MED ORDER — ACETAMINOPHEN 325 MG PO TABS
650.0000 mg | ORAL_TABLET | Freq: Four times a day (QID) | ORAL | 0 refills | Status: DC | PRN
  Filled 2021-11-11: qty 36, 5d supply, fill #0

## 2021-11-11 MED ORDER — ACETAMINOPHEN 325 MG PO TABS
650.0000 mg | ORAL_TABLET | Freq: Once | ORAL | Status: AC
  Administered 2021-11-11: 650 mg via ORAL
  Filled 2021-11-11: qty 2

## 2021-11-11 NOTE — ED Provider Notes (Signed)
?[facility] EMERGENCY DEPARTMENT ?Provider Note ? ? ?CSN: [id number] ?Arrival date & time: 11/11/21  0950 ? ?  ? ?History ? ?Chief Complaint  ?Patient presents with  ? Hand Injury  ? ? ?Victoria Turner is a 26 y.o. female. ? ? Patient as above with significant medical history as below, including GERD, HPV who presents to the ED with complaint of right hand injury injury.  Patient reports she punched a wall a few times a couple days ago because she was upset.  Reports pain to her right hand, more so on her pinky and ring finger.  She is RHD.  She has been applying ice over the past couple days with improvement of the discomfort.  Swelling is improved.  She still having some result discomfort.  No medications prior to arrival.  No numbness or tingling.  No elbow or shoulder pain.  No other injuries reported.  Unsure of last tetanus shot. ?  ? ? ? ?Past Medical History: ?No date: Allergy ?    Comment:  seasonal allergies ?09/02/2018: Eclampsia ?No date: GERD (gastroesophageal reflux disease) ?12/29/2019: Gonorrhea ?No date: History of heavy periods ?No date: HPV (human papilloma virus) infection ?09/04/2018: Ileus (HCC) ?09/02/2018: Severe preeclampsia, third trimester ? ?Past Surgical History: ?09/02/2018: CESAREAN SECTION; N/A ?    Comment:  Procedure: CESAREAN SECTION;  Surgeon: Erin Fulling,  ?             Eber Jones, MD;  Location: [facility] LD [facility]S;  Service: Obstetrics;  ?             Laterality: N/A;  ? ? ?The history is provided by the patient. No language interpreter was used.  ?Hand Injury ?Associated symptoms: no fever   ? ?  ? ?Home Medications ?Prior to Admission medications   ?Medication Sig Start Date End Date Taking? Authorizing Provider  ?acetaminophen (TYLENOL) 325 MG tablet Take 2 tablets (650 mg total) by mouth every 6 (six) hours as needed. 11/11/21  Yes Sloan Leiter, DO  ?clindamycin (CLEOCIN) 300 MG capsule Take 1 capsule (300 mg total) by mouth 2 (two) times daily. 04/09/21    LampteyBritta Mccreedy, MD  ?emtricitabine-tenofovir AF (DESCOVY) 200-25 MG tablet Take 1 tablet by mouth daily. 10/10/20   Eliezer Bottom, MD  ?famotidine (PEPCID) 20 MG tablet Take 1 tablet (20 mg total) by mouth 2 (two) times daily. 04/09/21   LampteyBritta Mccreedy, MD  ?loratadine-pseudoephedrine (CLARITIN-D 12 HOUR) 5-120 MG tablet Take 1 tablet by mouth 2 (two) times daily. 11/18/19   Eustace Moore, MD  ?nitrofurantoin, macrocrystal-monohydrate, (MACROBID) 100 MG capsule Take 1 capsule (100 mg total) by mouth 2 (two) times daily. 04/10/21   LampteyBritta Mccreedy, MD  ?oseltamivir (TAMIFLU) 75 MG capsule Take 1 capsule (75 mg total) by mouth every 12 (twelve) hours. 06/08/21   Raspet, Noberto Retort, PA-C  ?predniSONE (DELTASONE) 20 MG tablet Take 2 tablets (40 mg total) by mouth daily. 12/13/20   Mardella Layman, MD  ?promethazine-dextromethorphan (PROMETHAZINE-DM) 6.25-15 MG/5ML syrup Take 5 mLs by mouth 3 (three) times daily as needed for cough. 06/08/21   Raspet, Noberto Retort, PA-C  ?sertraline (ZOLOFT) 25 MG tablet Take 1 tablet (25 mg total) by mouth daily. 12/29/19   Claudean Severance, MD  ?traMADol (ULTRAM) 50 MG tablet Take 1 tablet (50 mg total) by mouth every 6 (six) hours as needed for moderate pain. 08/15/19   Anyanwu, Jethro Bastos, MD  ?norethindrone (MICRONOR,CAMILA,ERRIN) 0.35 MG tablet Take 1 tablet (0.35 mg  total) by mouth daily. ?Patient not taking: Reported on 08/10/2019 10/06/18 11/18/19  Conan Bowens, MD  ?   ? ?Allergies    ?Bee pollen and Pollen extract   ? ?Review of Systems   ?Review of Systems  ?Constitutional:  Negative for chills and fever.  ?HENT:  Negative for facial swelling and trouble swallowing.   ?Eyes:  Negative for photophobia and visual disturbance.  ?Respiratory:  Negative for cough and shortness of breath.   ?Cardiovascular:  Negative for chest pain and palpitations.  ?Gastrointestinal:  Negative for abdominal pain, nausea and vomiting.  ?Endocrine: Negative for polydipsia and polyuria.  ?Genitourinary:  Negative  for difficulty urinating and hematuria.  ?Musculoskeletal:  Positive for arthralgias. Negative for gait problem and joint swelling.  ?Skin:  Positive for wound. Negative for pallor and rash.  ?Neurological:  Negative for syncope and headaches.  ?Psychiatric/Behavioral:  Negative for agitation and confusion.   ? ?Physical Exam ?Updated Vital Signs ?BP 102/69 (BP Location: Left Arm)   Pulse 73   Temp 98.2 ?F (36.8 ?C) (Oral)   Resp 16   Ht 5\' 2"  (1.575 m)   Wt 45.4 kg   LMP 10/11/2021 (Approximate)   SpO2 98%   BMI 18.29 kg/m?  ?Physical Exam ?Vitals and nursing note reviewed.  ?Constitutional:   ?   General: She is not in acute distress. ?   Appearance: Normal appearance.  ?HENT:  ?   Head: Normocephalic and atraumatic.  ?   Right Ear: External ear normal.  ?   Left Ear: External ear normal.  ?   Nose: Nose normal.  ?   Mouth/Throat:  ?   Mouth: Mucous membranes are moist.  ?Eyes:  ?   General: No scleral icterus.    ?   Right eye: No discharge.     ?   Left eye: No discharge.  ?Cardiovascular:  ?   Rate and Rhythm: Normal rate and regular rhythm.  ?   Pulses: Normal pulses.  ?   Heart sounds: Normal heart sounds.  ?Pulmonary:  ?   Effort: Pulmonary effort is normal. No respiratory distress.  ?   Breath sounds: Normal breath sounds.  ?Abdominal:  ?   General: Abdomen is flat.  ?   Tenderness: There is no abdominal tenderness.  ?Musculoskeletal:     ?   General: Normal range of motion.  ?     Hands: ? ?   Cervical back: Normal range of motion.  ?   Right lower leg: No edema.  ?   Left lower leg: No edema.  ?   Comments: Bruising to the dorsum of the right hand.  More so on the lateral aspect as well.  Neurovascular intact to radial, ulnar median nerve distributions.  Radial pulse 2+ equal bilateral.  Full range of motion to elbow and shoulder ipsilateral.   ?Skin: ?   General: Skin is warm and dry.  ?   Capillary Refill: Capillary refill takes less than 2 seconds.  ?Neurological:  ?   Mental Status: She is  alert and oriented to person, place, and time.  ?   GCS: GCS eye subscore is 4. GCS verbal subscore is 5. GCS motor subscore is 6.  ?Psychiatric:     ?   Mood and Affect: Mood normal.     ?   Behavior: Behavior normal.  ? ? ?ED Results / Procedures / Treatments   ?Labs ?(all labs ordered are listed, but only abnormal results are  displayed) ?Labs Reviewed - No data to display ? ?EKG ?None ? ?Radiology ?DG Hand Complete Right ? ?Result Date: 11/11/2021 ?CLINICAL DATA:  Pain and swelling, trauma, punched a wall 2 days earlier EXAM: RIGHT HAND - COMPLETE 3+ VIEW COMPARISON:  None Available. FINDINGS: There is no evidence of fracture or dislocation. There is no evidence of arthropathy or other focal bone abnormality. Soft tissues are unremarkable. IMPRESSION: No fracture or dislocation is seen in the right hand. Electronically Signed   By: Ernie AvenaPalani  Rathinasamy M.D.   On: 11/11/2021 10:35   ? ?Procedures ?Procedures  ? ? ?Medications Ordered in ED ?Medications  ?acetaminophen (TYLENOL) tablet 650 mg (650 mg Oral Given 11/11/21 1124)  ?Tdap (BOOSTRIX) injection 0.5 mL (0.5 mLs Intramuscular Given 11/11/21 1124)  ? ? ?ED Course/ Medical Decision Making/ A&P ?  ?                        ?Medical Decision Making ?Amount and/or Complexity of Data Reviewed ?Radiology: ordered. ? ?Risk ?OTC drugs. ?Prescription drug management. ? ? ? ?CC: Right hand injury ? ?This patient presents to the Emergency Department for the above complaint. This involves an extensive number of treatment options and is a complaint that carries with it a high risk of complications and morbidity. Vital signs were reviewed. Serious etiologies considered. ? ?DDx includes not limited to, soft tissue, fracture, MSK, superficial injury, abrasion, other acute process ? ?Record review:  ?Previous records obtained and reviewed  ?Prior ED visits, prior labs and imaging.  Home medications ? ?Additional history obtained from N/A ? ?Medical and surgical history as noted  above.  ? ?Work up as above, notable for:  ?Labs & imaging results that were available during my care of the patient were visualized by me and considered in my medical decision making. ?  ?I ordered imaging studies which

## 2021-11-11 NOTE — Discharge Instructions (Addendum)
It was a pleasure caring for you today in the emergency department. ° °Please return to the emergency department for any worsening or worrisome symptoms. ° ° °

## 2021-11-11 NOTE — ED Triage Notes (Signed)
Reports punched a wall approx 2 days ago and complains of right 5th metacarpal pain.  ?

## 2021-11-25 ENCOUNTER — Encounter: Payer: Medicaid Other | Admitting: Student

## 2021-11-26 ENCOUNTER — Encounter: Payer: Self-pay | Admitting: Internal Medicine

## 2022-01-27 ENCOUNTER — Encounter (HOSPITAL_COMMUNITY): Payer: Self-pay

## 2022-01-27 ENCOUNTER — Ambulatory Visit (HOSPITAL_COMMUNITY)
Admission: EM | Admit: 2022-01-27 | Discharge: 2022-01-27 | Disposition: A | Discharge status: Home / Self Care | Payer: Medicaid Other | Attending: Emergency Medicine | Admitting: Emergency Medicine

## 2022-01-27 DIAGNOSIS — H5711 Ocular pain, right eye: Secondary | ICD-10-CM

## 2022-01-27 DIAGNOSIS — H538 Other visual disturbances: Secondary | ICD-10-CM | POA: Diagnosis not present

## 2022-01-27 MED ORDER — KETOROLAC TROMETHAMINE 0.5 % OP SOLN: 1.0000 [drp] | Freq: Four times a day (QID) | OPHTHALMIC | 0 refills | Status: DC

## 2022-01-27 NOTE — ED Triage Notes (Signed)
Pt reports bilateral eye pain and swelling after using her Aunt's steroid eye drops this morning.

## 2022-01-27 NOTE — Discharge Instructions (Addendum)
Unknown cause of your symptoms at this time, there were no abnormalities noted to your eye  Been given information for the eye specialist, please reach out and schedule an appointment for further evaluation  Acular eyedrop every 6 hours as needed to help manage pain, this medicine works to reduce inflammation that may be contributing  Tylenol and ibuprofen for management of discomfort  May hold Cool compresses to the eye as needed for additional support

## 2022-01-27 NOTE — ED Provider Notes (Signed)
[facility]    CSN: [id number] Arrival date & time: 01/27/22  1136      History   Chief Complaint Chief Complaint  Patient presents with   Eye Problem    HPI Victoria Turner is a 26 y.o. female.   Patient presents with blurriness of left eye pain to the right eye beginning this morning at approximately 9 AM.  Symptoms started abruptly without precipitating event, trauma.  Endorses that she was at the beach over the weekend but denies foreign body sensation.  Attempted use of a family member's prednisone eyedrops which caused pain to worsen.  Pain is described as a constant throbbing sensation.  Denies drainage, pruritus, watering, light sensitivity.  Endorses history of or contacts but endorses that in high school she did and's just stop to use, endorses baseline vision is clear.    Past Medical History:  Diagnosis Date   Allergy    seasonal allergies   Eclampsia 09/02/2018   GERD (gastroesophageal reflux disease)    Gonorrhea 12/29/2019   History of heavy periods    HPV (human papilloma virus) infection    Ileus (HCC) 09/04/2018   Severe preeclampsia, third trimester 09/02/2018    Patient Active Problem List   Diagnosis Date Noted   Breast lump on right side at 12 o'clock position 05/04/2020   Vaginal discharge 05/04/2020   Menorrhagia 12/29/2019   Seizures (HCC) 12/29/2019   Depression 12/29/2019   High risk sexual behavior 08/15/2015   Other seasonal allergic rhinitis 01/12/2014    Past Surgical History:  Procedure Laterality Date   CESAREAN SECTION N/A 09/02/2018   Procedure: CESAREAN SECTION;  Surgeon: Willodean Rosenthal, MD;  Location: [facility] LD ORS;  Service: Obstetrics;  Laterality: N/A;    OB History     Gravida  1   Para  1   Term  0   Preterm  1   AB  0   Living  1      SAB  0   IAB  0   Ectopic  0   Multiple      Live Births  1            Home Medications    Prior to Admission medications   Medication  Sig Start Date End Date Taking? Authorizing Provider  acetaminophen (TYLENOL) 325 MG tablet Take 2 tablets (650 mg total) by mouth every 6 (six) hours as needed. 11/11/21   Sloan Leiter, DO  clindamycin (CLEOCIN) 300 MG capsule Take 1 capsule (300 mg total) by mouth 2 (two) times daily. 04/09/21   Merrilee Jansky, MD  emtricitabine-tenofovir AF (DESCOVY) 200-25 MG tablet Take 1 tablet by mouth daily. 10/10/20   Eliezer Bottom, MD  famotidine (PEPCID) 20 MG tablet Take 1 tablet (20 mg total) by mouth 2 (two) times daily. 04/09/21   Lamptey, Britta Mccreedy, MD  loratadine-pseudoephedrine (CLARITIN-D 12 HOUR) 5-120 MG tablet Take 1 tablet by mouth 2 (two) times daily. 11/18/19   Eustace Moore, MD  nitrofurantoin, macrocrystal-monohydrate, (MACROBID) 100 MG capsule Take 1 capsule (100 mg total) by mouth 2 (two) times daily. 04/10/21   LampteyBritta Mccreedy, MD  oseltamivir (TAMIFLU) 75 MG capsule Take 1 capsule (75 mg total) by mouth every 12 (twelve) hours. 06/08/21   Raspet, Noberto Retort, PA-C  predniSONE (DELTASONE) 20 MG tablet Take 2 tablets (40 mg total) by mouth daily. 12/13/20   Mardella Layman, MD  promethazine-dextromethorphan (PROMETHAZINE-DM) 6.25-15 MG/5ML syrup Take 5 mLs by mouth 3 (  three) times daily as needed for cough. 06/08/21   Raspet, Noberto Retort, PA-C  sertraline (ZOLOFT) 25 MG tablet Take 1 tablet (25 mg total) by mouth daily. 12/29/19   Claudean Severance, MD  traMADol (ULTRAM) 50 MG tablet Take 1 tablet (50 mg total) by mouth every 6 (six) hours as needed for moderate pain. 08/15/19   Anyanwu, Jethro Bastos, MD  norethindrone (MICRONOR,CAMILA,ERRIN) 0.35 MG tablet Take 1 tablet (0.35 mg total) by mouth daily. Patient not taking: Reported on 08/10/2019 10/06/18 11/18/19  Conan Bowens, MD    Family History Family History  Problem Relation Age of Onset   Hypertension Mother    Autism Sister    Autism Brother    Autism Sister    Autism Sister    ADD / ADHD Sister     Social History Social History   Tobacco  Use   Smoking status: Every Day    Types: Cigarettes   Smokeless tobacco: Never   Tobacco comments:    black and mild 4x week  Vaping Use   Vaping Use: Some days  Substance Use Topics   Alcohol use: Never   Drug use: Yes    Types: Marijuana     Allergies   Bee pollen and Pollen extract   Review of Systems Review of Systems  Constitutional: Negative.   Eyes:  Positive for pain and visual disturbance. Negative for photophobia, discharge, redness and itching.  Respiratory: Negative.    Cardiovascular: Negative.   Skin: Negative.   Neurological: Negative.      Physical Exam Triage Vital Signs ED Triage Vitals [01/27/22 1244]  Enc Vitals Group     BP 130/88     Pulse Rate 82     Resp 16     Temp 97.9 F (36.6 C)     Temp Source Oral     SpO2 98 %     Weight      Height      Head Circumference      Peak Flow      Pain Score      Pain Loc      Pain Edu?      Excl. in GC?    No data found.  Updated Vital Signs BP 130/88 (BP Location: Left Arm)   Pulse 82   Temp 97.9 F (36.6 C) (Oral)   Resp 16   SpO2 98%   Visual Acuity Right Eye Distance:   Left Eye Distance:   Bilateral Distance:    Right Eye Near:   Left Eye Near:    Bilateral Near:     Physical Exam Constitutional:      Appearance: Normal appearance.  Eyes:     Comments: No abnormalities noted to the sclera, iris or conjunctiva, no no erythema or drainage is present, no periorbital swelling present, vision is grossly intact, extraocular movements intact  Pulmonary:     Effort: Pulmonary effort is normal.  Skin:    General: Skin is warm and dry.  Neurological:     Mental Status: She is alert and oriented to person, place, and time. Mental status is at baseline.  Psychiatric:        Mood and Affect: Mood normal.        Behavior: Behavior normal.      UC Treatments / Results  Labs (all labs ordered are listed, but only abnormal results are displayed) Labs Reviewed - No data to  display  EKG   Radiology No  results found.  Procedures Procedures (including critical care time)  Medications Ordered in UC Medications - No data to display  Initial Impression / Assessment and Plan / UC Course  I have reviewed the triage vital signs and the nursing notes.  Pertinent labs & imaging results that were available during my care of the patient were reviewed by me and considered in my medical decision making (see chart for details).  Acute right eye pain, left eye blurry vision  No abnormalities noted on exam, eyewash completed bilaterally, patient endorses that the blurriness to the left eye has decreased but the pain to the right eye has persisted, fluorescein stain completed, no corneal abrasion noted , prescribed ocular eyedrops and given walking referral to ophthalmology for further evaluation and management Final Clinical Impressions(s) / UC Diagnoses   Final diagnoses:  None   Discharge Instructions   None    ED Prescriptions   None    PDMP not reviewed this encounter.   Valinda Hoar, NP 01/27/22 1434

## 2022-02-25 NOTE — Progress Notes (Unsigned)
CC: Eye Pain  HPI:   [name] is a 26 y.o. female with a past medical history of seizures, depression, and high-risk sexual activity who presents for bilateral eye pain. She was last seen in [facility] 10-2020.     Past Medical History:  Diagnosis Date   Allergy    seasonal allergies   Eclampsia 09/02/2018   GERD (gastroesophageal reflux disease)    Gonorrhea 12/29/2019   History of heavy periods    HPV (human papilloma virus) infection    Ileus (HCC) 09/04/2018   Severe preeclampsia, third trimester 09/02/2018     Review of Systems:    Reports eye pain, episodic blurry vision Denies fever, headache, vision loss, hearing loss   Physical Exam:  Vitals:   02/26/22 1407  BP: 103/69  Temp: 98.1 F (36.7 C)  TempSrc: Oral  SpO2: 100%  Weight: 93 lb 3.2 oz (42.3 kg)  Height: 5\' 2"  (1.575 m)    General:   awake and alert, sitting comfortably in chair, cooperative, not in acute distress Skin:   warm and dry, intact without any obvious lesions or scars, no rashes or lesions  Head:   normocephalic and atraumatic, oral mucosa moist Lungs:   normal respiratory effort, breathing unlabored, symmetrical chest rise, no crackles or wheezing Cardiac:   regular rate and rhythm, normal S1 and S2 Neurologic:   oriented to person-place-time, extraocular eye movements intact bilaterally, both round and reactive to light bilaterally, all visual fields intact, clear conjunctivae, no orbital swelling or facial droop Psychiatric:   mood and affect normal, intelligible speech    Assessment & Plan:   High risk sexual behavior Although patient has not been sexually active within the last year, she plans to re-engage in high-risk sexual behavior in the near future. During this period of abstinence, she discontinued emtricitabine-tenofovir but is now interested in restarting the medication. Additional testing is needed before reinitializing emtricitabine-tenofovir.  -Order  pregnancy test, CMP, HIV antigen, UA cytology, and Hep B panel -Return to clinic in three months for repeat HIV testing   Eye pain, bilateral Patient reports four weeks of bilateral eye pain that started after she awoke one morning. She describes the pain as a sharp shooting sensation affecting the deep lower parts of both eyes. The pain is constant and it is associated with intermittent periods of burning. She visited the ED for these symptoms on 7-24 where no abnormalities were found and she was discharged with eye drops. She has taken acetaminophen, aspirin, and eye drops but nothing provides relief. She denies discharge, redness, headache, and hearing or vision loss. Within the last two weeks, she has experienced three distinct episodes of blurry vision, most recently on 8-22 which lasted for the entire day and forced her to leave work early. On exam, extraocular eye movements were intact bilaterally, visual fields were intact bilaterally, pupils were round and reactive to light bilaterally, conjunctivae were clear, and there was no orbital swelling or facial droop. This presentation would be highly unusual for optic neuritis, retinal detachment, or closed-angle glaucoma. Etiology remains unclear.  -Refer urgently to ophthalmologist for further evaluation -Return to clinic in three months for follow-up   Urinary tract infection Patient reports about one week of frequent urination, urgency, cloudy urine, and discharge in underwear. She denies fever, chills, dysuria, abdominal pain, and she has not been sexually active within the last year. In the past, she has had several UTIs positive only for E.coli on culture. Her symptoms and history  are most consistent with an uncomplicated UTI. Dipstick urinalysis performed in clinic today was positive for protein and ketones.  -Order urinalysis with reflex microscopy -Prescribe five-day course of nitrofurantoin 100mg  q12     See Encounters Tab for  problem based charting.  Patient seen with Dr.  

## 2022-02-26 ENCOUNTER — Other Ambulatory Visit (HOSPITAL_COMMUNITY)
Admission: RE | Admit: 2022-02-26 | Discharge: 2022-02-26 | Disposition: A | Discharge status: Home / Self Care | Payer: Medicaid Other | Source: Ambulatory Visit | Attending: Internal Medicine | Admitting: Internal Medicine

## 2022-02-26 ENCOUNTER — Ambulatory Visit: Payer: Medicaid Other | Admitting: Student

## 2022-02-26 VITALS — BP 103/69 | Temp 98.1°F | Ht 62.0 in | Wt 93.2 lb

## 2022-02-26 DIAGNOSIS — Z7251 High risk heterosexual behavior: Secondary | ICD-10-CM

## 2022-02-26 DIAGNOSIS — H5713 Ocular pain, bilateral: Secondary | ICD-10-CM

## 2022-02-26 DIAGNOSIS — Z3202 Encounter for pregnancy test, result negative: Secondary | ICD-10-CM | POA: Diagnosis present

## 2022-02-26 DIAGNOSIS — N390 Urinary tract infection, site not specified: Secondary | ICD-10-CM | POA: Diagnosis not present

## 2022-02-26 DIAGNOSIS — Z8619 Personal history of other infectious and parasitic diseases: Secondary | ICD-10-CM | POA: Diagnosis not present

## 2022-02-26 DIAGNOSIS — R350 Frequency of micturition: Secondary | ICD-10-CM | POA: Insufficient documentation

## 2022-02-26 LAB — POCT URINE PREGNANCY: Preg Test, Ur: NEGATIVE

## 2022-02-26 MED ORDER — NITROFURANTOIN MONOHYD MACRO 100 MG PO CAPS: 100.0000 mg | ORAL_CAPSULE | Freq: Two times a day (BID) | ORAL | 0 refills | Status: DC

## 2022-02-26 MED ORDER — NITROFURANTOIN MONOHYD MACRO 100 MG PO CAPS
100.0000 mg | ORAL_CAPSULE | Freq: Two times a day (BID) | ORAL | 0 refills | Status: DC
Start: 2022-02-26 — End: 2022-02-27

## 2022-02-26 NOTE — Assessment & Plan Note (Addendum)
Although patient has not been sexually active within the last year, she plans to re-engage in high-risk sexual behavior in the near future. During this period of abstinence, she discontinued emtricitabine-tenofovir but is now interested in restarting the medication. Additional testing is needed before reinitializing emtricitabine-tenofovir.  -Order pregnancy test, CMP, HIV antigen, UA cytology, and Hep B panel -Return to clinic in three months for repeat HIV testing

## 2022-02-26 NOTE — Assessment & Plan Note (Addendum)
Patient reports four weeks of bilateral eye pain that started after she awoke one morning. She describes the pain as a sharp shooting sensation affecting the deep lower parts of both eyes. The pain is constant and it is associated with intermittent periods of burning. She visited the ED for these symptoms on 7-24 where no abnormalities were found and she was discharged with eye drops. She has taken acetaminophen, aspirin, and eye drops but nothing provides relief. She denies discharge, redness, headache, and hearing or vision loss. Within the last two weeks, she has experienced three distinct episodes of blurry vision, most recently on 8-22 which lasted for the entire day and forced her to leave work early. On exam, extraocular eye movements were intact bilaterally, visual fields were intact bilaterally, pupils were round and reactive to light bilaterally, conjunctivae were clear, and there was no orbital swelling or facial droop. This presentation would be highly unusual for optic neuritis, retinal detachment, or closed-angle glaucoma. Etiology remains unclear.  -Refer urgently to ophthalmologist for further evaluation -Return to clinic in three months for follow-up

## 2022-02-26 NOTE — Patient Instructions (Addendum)
  Thank you, [name], for allowing [place] to provide your care today. Today we discussed . . .  > Eye Pain       - we are referring you to ophthalmology for a thorough eye examination > Urinary Frequency       - we have ordered a urinalysis and are prescribing you nitrofurantoin for a possible urinary tract infection > Antiviral Medication       - we have ordered some blood work so that you can restart your antiviral medications   I have ordered the following labs for you:  Lab Orders         Urinalysis, Reflex Microscopic         CMP14 + Anion Gap         HIV antibody (with reflex)         Hepatitis B Surface Antibody         Hepatitis B Surface Antigen         Hepatitis B core Ab, Total         POCT Urine Pregnancy       Tests ordered today:  None   Referrals ordered today:   Referral Orders         Ambulatory referral to Ophthalmology       I have ordered the following medication/changed the following medications:   Stop the following medications: Medications Discontinued During This Encounter  Medication Reason   nitrofurantoin, macrocrystal-monohydrate, (MACROBID) 100 MG capsule Reorder     Start the following medications: Meds ordered this encounter  Medications   nitrofurantoin, macrocrystal-monohydrate, (MACROBID) 100 MG capsule    Sig: Take 1 capsule (100 mg total) by mouth 2 (two) times daily.    Dispense:  10 capsule    Refill:  0      Follow up: 3 months    Remember:  Please see the ophthalmologist for your eye pain. We will see you again in three months for repeat HIV testing.   Should you have any questions or concerns please call the internal medicine clinic at [phone].     Lajuana Ripple, MD [facility]

## 2022-02-26 NOTE — Assessment & Plan Note (Signed)
Patient reports about one week of frequent urination, urgency, cloudy urine, and discharge in underwear. She denies fever, chills, dysuria, abdominal pain, and she has not been sexually active within the last year. In the past, she has had several UTIs positive only for E.coli on culture. Her symptoms and history are most consistent with an uncomplicated UTI. Dipstick urinalysis performed in clinic today was positive for protein and ketones.  -Order urinalysis with reflex microscopy -Prescribe five-day course of nitrofurantoin 100mg  q12

## 2022-02-27 LAB — URINALYSIS, ROUTINE W REFLEX MICROSCOPIC
Bilirubin, UA: NEGATIVE
Glucose, UA: NEGATIVE
Leukocytes,UA: NEGATIVE
Nitrite, UA: NEGATIVE
RBC, UA: NEGATIVE
Specific Gravity, UA: 1.027 (ref 1.005–1.030)
Urobilinogen, Ur: 0.2 mg/dL (ref 0.2–1.0)
pH, UA: 7 (ref 5.0–7.5)

## 2022-02-27 MED ORDER — DESCOVY 200-25 MG PO TABS
1.0000 | ORAL_TABLET | Freq: Every day | ORAL | 0 refills | Status: DC
Start: 2022-02-27 — End: 2022-07-30

## 2022-02-27 NOTE — Addendum Note (Signed)
Addended by: Crissie Sickles on: 02/27/2022 12:58 PM   Modules accepted: Orders

## 2022-02-28 LAB — URINE CYTOLOGY ANCILLARY ONLY
Bacterial Vaginitis-Urine: NEGATIVE
Candida Urine: NEGATIVE
Chlamydia: NEGATIVE
Comment: NEGATIVE
Comment: NEGATIVE
Comment: NORMAL
Neisseria Gonorrhea: NEGATIVE
Trichomonas: NEGATIVE

## 2022-03-01 LAB — CMP14 + ANION GAP
ALT: 6 IU/L (ref 0–32)
AST: 16 IU/L (ref 0–40)
Albumin/Globulin Ratio: 2.1 (ref 1.2–2.2)
Albumin: 4.7 g/dL (ref 4.0–5.0)
Alkaline Phosphatase: 71 IU/L (ref 44–121)
Anion Gap: 18 mmol/L (ref 10.0–18.0)
BUN/Creatinine Ratio: 17 (ref 9–23)
BUN: 12 mg/dL (ref 6–20)
Bilirubin Total: <0.2 mg/dL (ref 0.0–1.2)
CO2: 22 mmol/L (ref 20–29)
Calcium: 9.3 mg/dL (ref 8.7–10.2)
Chloride: 104 mmol/L (ref 96–106)
Creatinine, Ser: 0.71 mg/dL (ref 0.57–1.00)
Globulin, Total: 2.2 g/dL (ref 1.5–4.5)
Glucose: 86 mg/dL (ref 70–99)
Potassium: 4.7 mmol/L (ref 3.5–5.2)
Sodium: 144 mmol/L (ref 134–144)
Total Protein: 6.9 g/dL (ref 6.0–8.5)
eGFR: 122 mL/min/{1.73_m2} (ref >59)

## 2022-03-01 LAB — HIV ANTIBODY (ROUTINE TESTING W REFLEX): HIV Screen 4th Generation wRfx: NONREACTIVE

## 2022-03-01 LAB — HEPATITIS B CORE ANTIBODY, TOTAL: Hep B Core Total Ab: NEGATIVE

## 2022-03-01 LAB — HEPATITIS B SURFACE ANTIGEN: Hepatitis B Surface Ag: NEGATIVE

## 2022-03-01 LAB — HEPATITIS B SURFACE ANTIBODY,QUALITATIVE: Hep B Surface Ab, Qual: NONREACTIVE

## 2022-03-04 NOTE — Progress Notes (Signed)
Internal Medicine Clinic Attending  I saw and evaluated the patient.  I personally confirmed the key portions of the history and exam documented by Dr. Clearance Coots and I reviewed pertinent patient test results.  The assessment, diagnosis, and plan were formulated together and I agree with the documentation in the resident's note.   History notable for prior gonorrhea (c/b PID) and trichomonas infections. Victoria Turner was initiated on HIV pre-exposure prophylaxis 10/2020 but was since lost to f/u. She returns to clinic noting no sexual activity in the last year but planning to become sexually active again. We discussed barrier protection for prevention of STIs, however Ms. Harrington-Stewart also wishes to resume PrEP with [facility]. Given prior exposure and risk of recurrent STI PrEP is reasonable to resume. HIV, hepatitis, and additional testing performed prior to resuming. We discussed the importance of f/u for repeat testing and monitoring.  Urgent eye appointment made with Dr. Laruth Bouchard office 02/27/22 for bilateral eye pain.

## 2022-04-21 ENCOUNTER — Encounter: Payer: Self-pay | Admitting: Student

## 2022-04-21 ENCOUNTER — Encounter: Payer: Medicaid Other | Admitting: Internal Medicine

## 2022-05-19 ENCOUNTER — Telehealth: Payer: Self-pay | Admitting: Student

## 2022-05-19 ENCOUNTER — Ambulatory Visit (INDEPENDENT_AMBULATORY_CARE_PROVIDER_SITE_OTHER): Payer: Medicaid Other

## 2022-05-19 ENCOUNTER — Other Ambulatory Visit: Payer: Self-pay

## 2022-05-19 ENCOUNTER — Encounter (HOSPITAL_COMMUNITY): Payer: Self-pay | Admitting: Emergency Medicine

## 2022-05-19 ENCOUNTER — Ambulatory Visit (HOSPITAL_COMMUNITY)
Admission: EM | Admit: 2022-05-19 | Discharge: 2022-05-19 | Disposition: A | Discharge status: Home / Self Care | Payer: Medicaid Other | Attending: Family Medicine | Admitting: Family Medicine

## 2022-05-19 DIAGNOSIS — R109 Unspecified abdominal pain: Secondary | ICD-10-CM

## 2022-05-19 LAB — POCT URINALYSIS DIPSTICK, ED / UC
Bilirubin Urine: NEGATIVE
Glucose, UA: NEGATIVE mg/dL
Hgb urine dipstick: NEGATIVE
Ketones, ur: >=160 mg/dL — AB
Leukocytes,Ua: NEGATIVE
Nitrite: NEGATIVE
Protein, ur: NEGATIVE mg/dL
Specific Gravity, Urine: 1.025 (ref 1.005–1.030)
Urobilinogen, UA: 0.2 mg/dL (ref 0.0–1.0)
pH: 5.5 (ref 5.0–8.0)

## 2022-05-19 LAB — POC URINE PREG, ED: Preg Test, Ur: NEGATIVE

## 2022-05-19 MED ORDER — CVS FIBER 0.52 G PO CAPS: 0.5200 g | ORAL_CAPSULE | Freq: Every day | ORAL | 0 refills | Status: DC

## 2022-05-19 MED ORDER — DICYCLOMINE HCL 10 MG PO CAPS: 10.0000 mg | ORAL_CAPSULE | Freq: Three times a day (TID) | ORAL | 0 refills | Status: DC

## 2022-05-19 NOTE — Discharge Instructions (Addendum)
The x-ray of your abdomen did show that you have a decent amount of stool.  Use fiber supplement to encourage having 1 bowel movement per day.  Eat a bland diet.  Use dicyclomine for abdominal cramping and pain.  Make sure that you are resting and drinking plenty of fluid.  If your symptoms are not improving please follow-up with GI; call to schedule appointment.  If you have any worsening symptoms including severe abdominal pain in 1 part of your abdomen, fever, nausea, vomiting, diarrhea, blood in your stool, blood in your vomit, weakness you need to go to the emergency room immediately.

## 2022-05-19 NOTE — ED Provider Notes (Signed)
[place]    CSN: [id number] Arrival date & time: 05/19/22  [id number]      History   Chief Complaint Chief Complaint  Patient presents with   Abdominal Pain    HPI Victoria Turner is a 26 y.o. female.   Patient presents today with a 3-day history of increased flatulence and a 2-day history of left-sided abdominal pain.  She reports that pain is rated 8 on a 0-10 pain scale at rest but increases to 10 with lifting or movement, described as sharp/cramping, no aggravating or alleviating factors identified.  She denies any diarrhea or vomiting.  Has had some nausea intermittently.  She is having some urinary frequency but denies any urgency, dysuria, hematuria.  She reports her last bowel movement was yesterday and was normal.  Denies any melena or hematochezia.  She does have a history of GERD but denies history of ulcerative colitis, Crohn's disease, diverticulitis.  Denies any medication changes or recent antibiotic use.  Denies any known sick contacts or recent travel.  Denies any dietary changes or suspicious food intake.  She is having difficulty with daily activities as he had to leave work as a result of symptoms.    Past Medical History:  Diagnosis Date   Allergy    seasonal allergies   Eclampsia 09/02/2018   GERD (gastroesophageal reflux disease)    Gonorrhea 12/29/2019   History of heavy periods    HPV (human papilloma virus) infection    Ileus ([place]) 09/04/2018   Severe preeclampsia, third trimester 09/02/2018    Patient Active Problem List   Diagnosis Date Noted   Eye pain, bilateral 02/26/2022   Breast lump on right side at 12 o'clock position 05/04/2020   Vaginal discharge 05/04/2020   Menorrhagia 12/29/2019   Seizures ([place]) 12/29/2019   Depression 12/29/2019   Urinary tract infection 08/15/2015   History of sexually transmitted disease 08/15/2015   Other seasonal allergic rhinitis 01/12/2014    Past Surgical History:  Procedure Laterality  Date   CESAREAN SECTION N/A 09/02/2018   Procedure: CESAREAN SECTION;  Surgeon: Lavonia Drafts, MD;  Location: [facility];  Service: Obstetrics;  Laterality: N/A;    OB History     Gravida  1   Para  1   Term  0   Preterm  1   AB  0   Living  1      SAB  0   IAB  0   Ectopic  0   Multiple      Live Births  1            Home Medications    Prior to Admission medications   Medication Sig Start Date End Date Taking? Authorizing Provider  dicyclomine (BENTYL) 10 MG capsule Take 1 capsule (10 mg total) by mouth 4 (four) times daily -  before meals and at bedtime. 05/19/22  Yes Brentney Goldbach K, PA-C  psyllium ([employer] FIBER) 0.52 g capsule Take 1 capsule (0.52 g total) by mouth daily. 05/19/22  Yes Melda Mermelstein, Derry Skill, PA-C  acetaminophen (TYLENOL) 325 MG tablet Take 2 tablets (650 mg total) by mouth every 6 (six) hours as needed. 11/11/21   Jeanell Sparrow, DO  emtricitabine-tenofovir AF (DESCOVY) 200-25 MG tablet Take 1 tablet by mouth daily. Patient not taking: Reported on 05/19/2022 02/27/22   Serita Butcher, MD  famotidine (PEPCID) 20 MG tablet Take 1 tablet (20 mg total) by mouth 2 (two) times daily. Patient not taking: Reported on 05/19/2022  04/09/21   Lamptey, Britta Mccreedy, MD  ketorolac (ACULAR) 0.5 % ophthalmic solution Place 1 drop into both eyes every 6 (six) hours. Patient not taking: Reported on 05/19/2022 01/27/22   Valinda Hoar, NP  promethazine-dextromethorphan (PROMETHAZINE-DM) 6.25-15 MG/5ML syrup Take 5 mLs by mouth 3 (three) times daily as needed for cough. 06/08/21   Rosealee Recinos, Noberto Retort, PA-C  sertraline (ZOLOFT) 25 MG tablet Take 1 tablet (25 mg total) by mouth daily. Patient not taking: Reported on 05/19/2022 12/29/19   Claudean Severance, MD  norethindrone (MICRONOR,CAMILA,ERRIN) 0.35 MG tablet Take 1 tablet (0.35 mg total) by mouth daily. Patient not taking: Reported on 08/10/2019 10/06/18 11/18/19  Conan Bowens, MD    Family History Family History   Problem Relation Age of Onset   Hypertension Mother    Autism Sister    Autism Brother    Autism Sister    Autism Sister    ADD / ADHD Sister     Social History Social History   Tobacco Use   Smoking status: Former    Types: Cigarettes   Smokeless tobacco: Never   Tobacco comments:    black and mild 4x week  Vaping Use   Vaping Use: Some days  Substance Use Topics   Alcohol use: Never   Drug use: Yes    Types: Marijuana     Allergies   Bee pollen and Pollen extract   Review of Systems Review of Systems  Constitutional:  Positive for activity change. Negative for appetite change, fatigue and fever.  Respiratory:  Negative for shortness of breath.   Cardiovascular:  Negative for chest pain.  Gastrointestinal:  Positive for abdominal pain and nausea. Negative for blood in stool, constipation, diarrhea and vomiting.  Neurological:  Negative for dizziness, light-headedness and headaches.     Physical Exam Triage Vital Signs ED Triage Vitals  Enc Vitals Group     BP 05/19/22 1125 103/75     Pulse Rate 05/19/22 1125 80     Resp 05/19/22 1125 18     Temp 05/19/22 1125 98.4 F (36.9 C)     Temp Source 05/19/22 1125 Oral     SpO2 05/19/22 1125 100 %     Weight --      Height --      Head Circumference --      Peak Flow --      Pain Score 05/19/22 1121 8     Pain Loc --      Pain Edu? --      Excl. in GC? --    No data found.  Updated Vital Signs BP 103/75 (BP Location: Left Arm)   Pulse 80   Temp 98.4 F (36.9 C) (Oral)   Resp 18   SpO2 100%   Visual Acuity Right Eye Distance:   Left Eye Distance:   Bilateral Distance:    Right Eye Near:   Left Eye Near:    Bilateral Near:     Physical Exam Vitals reviewed.  Constitutional:      General: She is awake. She is not in acute distress.    Appearance: Normal appearance. She is well-developed. She is not ill-appearing.     Comments: Very pleasant female appears stated age in no acute distress  sitting comfortably in exam room  HENT:     Head: Normocephalic and atraumatic.     Mouth/Throat:     Pharynx: Uvula midline. No oropharyngeal exudate or posterior oropharyngeal erythema.  Cardiovascular:  Rate and Rhythm: Normal rate and regular rhythm.     Heart sounds: Normal heart sounds, S1 normal and S2 normal. No murmur heard. Pulmonary:     Effort: Pulmonary effort is normal.     Breath sounds: Normal breath sounds. No wheezing, rhonchi or rales.     Comments: Clear to auscultation bilaterally Abdominal:     General: Bowel sounds are normal.     Palpations: Abdomen is soft.     Tenderness: There is abdominal tenderness in the epigastric area, left upper quadrant and left lower quadrant. There is no right CVA tenderness, left CVA tenderness, guarding or rebound.  Psychiatric:        Behavior: Behavior is cooperative.      UC Treatments / Results  Labs (all labs ordered are listed, but only abnormal results are displayed) Labs Reviewed  POCT URINALYSIS DIPSTICK, ED / UC - Abnormal; Notable for the following components:      Result Value   Ketones, ur >=160 (*)    All other components within normal limits  POC URINE PREG, ED    EKG   Radiology DG Abdomen 1 View  Result Date: 05/19/2022 CLINICAL DATA:  Abdominal pain EXAM: ABDOMEN - 1 VIEW COMPARISON:  None Available. FINDINGS: Bowel gas pattern is nonspecific. Small to moderate amount of stool is seen in colon without evidence of fecal impaction in rectum. No abnormal masses or calcifications are seen. Kidneys are partly obscured by bowel contents. Visualized lower lung fields and bony structures are unremarkable. IMPRESSION: Nonspecific bowel gas pattern. Electronically Signed   By: Elmer Picker M.D.   On: 05/19/2022 12:18    Procedures Procedures (including critical care time)  Medications Ordered in UC Medications - No data to display  Initial Impression / Assessment and Plan / UC Course  I have  reviewed the triage vital signs and the nursing notes.  Pertinent labs & imaging results that were available during my care of the patient were reviewed by me and considered in my medical decision making (see chart for details).     Patient is well-appearing, afebrile, nontoxic, nontachycardic.  No acute abdomen on exam that would warrant emergent evaluation.  KUB was obtained that showed moderate stool and believes this could be contributing to symptoms.  She was encouraged to start a fiber supplement this was sent to pharmacy.  Recommended that she use dietary management for additional symptom relief.  She was started on dicyclomine to help with abdominal cramping/pain.  She is to rest and drink plenty of fluid.  Low suspicion for diverticulitis as she is young, afebrile, nontoxic, with no focal tenderness.  We discussed that we do not have advanced imaging capabilities in urgent care and if she has persistent/worsening symptoms she needs to go to the emergency room for further evaluation and management.  If her symptoms are not worsening but also not improving she is to follow-up with GI; was given contact information for local provider with instruction to call to schedule an appointment.  Discussed that if she has any worsening symptoms including focal tenderness, nausea, vomiting, diarrhea, blood in her stool, blood in her vomit she needs to go to the emergency room immediately.  Strict return precautions given.  Work excuse note provided.  Final Clinical Impressions(s) / UC Diagnoses   Final diagnoses:  Abdominal cramping     Discharge Instructions      The x-ray of your abdomen did show that you have a decent amount of stool.  Use  fiber supplement to encourage having 1 bowel movement per day.  Eat a bland diet.  Use dicyclomine for abdominal cramping and pain.  Make sure that you are resting and drinking plenty of fluid.  If your symptoms are not improving please follow-up with GI; call to  schedule appointment.  If you have any worsening symptoms including severe abdominal pain in 1 part of your abdomen, fever, nausea, vomiting, diarrhea, blood in your stool, blood in your vomit, weakness you need to go to the emergency room immediately.     ED Prescriptions     Medication Sig Dispense Auth. Provider   dicyclomine (BENTYL) 10 MG capsule Take 1 capsule (10 mg total) by mouth 4 (four) times daily -  before meals and at bedtime. 28 capsule Emberli Ballester K, PA-C   psyllium ([employer] FIBER) 0.52 g capsule Take 1 capsule (0.52 g total) by mouth daily. 30 capsule Kristin Lamagna K, PA-C      PDMP not reviewed this encounter.   Terrilee Croak, PA-C 05/19/22 1240

## 2022-05-19 NOTE — Telephone Encounter (Signed)
Returned call to patient. Line goes directly to recording, "The person you have dialed is not able to receive calls at this time."

## 2022-05-19 NOTE — Telephone Encounter (Signed)
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  Pt is asking what she should do.  Pt is already sch for an appt on 05/21/2022

## 2022-05-19 NOTE — ED Triage Notes (Signed)
For 3 days has had sharp shooting pain in lower left abdomen and complains of flatulence.  Last BM was Saturday.  Reports a small, but normal BM.  Denies pain with urination, but urinating more frequently

## 2022-05-21 ENCOUNTER — Encounter: Payer: Medicaid Other | Admitting: Internal Medicine

## 2022-05-21 ENCOUNTER — Encounter (HOSPITAL_COMMUNITY): Payer: Self-pay

## 2022-05-21 ENCOUNTER — Encounter: Payer: Self-pay | Admitting: Student

## 2022-05-21 ENCOUNTER — Ambulatory Visit (HOSPITAL_COMMUNITY)
Admission: EM | Admit: 2022-05-21 | Discharge: 2022-05-21 | Disposition: A | Discharge status: Home / Self Care | Payer: Medicaid Other | Attending: Family Medicine | Admitting: Family Medicine

## 2022-05-21 DIAGNOSIS — Z1152 Encounter for screening for COVID-19: Secondary | ICD-10-CM | POA: Insufficient documentation

## 2022-05-21 DIAGNOSIS — R058 Other specified cough: Secondary | ICD-10-CM | POA: Diagnosis not present

## 2022-05-21 DIAGNOSIS — J029 Acute pharyngitis, unspecified: Secondary | ICD-10-CM | POA: Diagnosis not present

## 2022-05-21 DIAGNOSIS — J069 Acute upper respiratory infection, unspecified: Secondary | ICD-10-CM | POA: Insufficient documentation

## 2022-05-21 LAB — POCT RAPID STREP A, ED / UC: Streptococcus, Group A Screen (Direct): NEGATIVE

## 2022-05-21 MED ORDER — PROMETHAZINE-DM 6.25-15 MG/5ML PO SYRP: 5.0000 mL | ORAL_SOLUTION | Freq: Four times a day (QID) | ORAL | 0 refills | Status: DC | PRN

## 2022-05-21 MED ORDER — LIDOCAINE VISCOUS HCL 2 % MT SOLN: 5.0000 mL | Freq: Four times a day (QID) | OROMUCOSAL | 0 refills | Status: DC | PRN

## 2022-05-21 NOTE — ED Triage Notes (Signed)
Cough, sore throat and body aches onset 3 days ago. Patient was seen 11/13 but still not felling well.   Patient's daughter and fiance are sick as well. 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

## 2022-05-21 NOTE — Discharge Instructions (Signed)
You may use over the counter ibuprofen or acetaminophen as needed.  For a sore throat, over the counter products such as [employer] Peroxyl Mouth Sore Rinse or Chloraseptic Sore Throat Spray may provide some temporary relief. Your rapid strep test was negative today. We have sent your throat swab for culture and will let you know of any positive results.  You have been tested for COVID-19 today. If your test returns positive, you will receive a phone call from [facility] regarding your results. Negative test results are not called. Both positive and negative results area always visible on MyChart. If you do not have a MyChart account, sign up instructions are provided in your discharge papers. Please do not hesitate to contact us should you have questions or concerns.  

## 2022-05-21 NOTE — Progress Notes (Deleted)
Abd cramping 11/13 KUB showed constipation

## 2022-05-22 LAB — SARS CORONAVIRUS 2 (TAT 6-24 HRS): SARS Coronavirus 2: NEGATIVE

## 2022-05-24 NOTE — ED Provider Notes (Signed)
[facility]   [id number] 05/21/22 Arrival Time: [id number]  ASSESSMENT & PLAN:  1. Sore throat   2. Viral URI with cough    COVID negative. Rapid strep negative. Discussed typical duration of viral illness. OTC symptom care as needed.  Discharge Medication List as of 05/21/2022 11:40 AM     START taking these medications   Details  magic mouthwash (lidocaine, diphenhydrAMINE, alum & mag hydroxide) suspension Swish and spit 5 mLs 4 (four) times daily as needed for mouth pain., Starting Wed 05/21/2022, Normal       Work note provided.   Follow-up Information     Romana Juniper, MD.   Why: As needed. Contact information: [place] [address] [phone]                 Reviewed expectations re: course of current medical issues. Questions answered. Outlined signs and symptoms indicating need for more acute intervention. Understanding verbalized. After Visit Summary given.   SUBJECTIVE: History from: Patient. Victoria Turner is a 26 y.o. female. Reports: Cough, sore throat and body aches onset 3 days ago. Patient was seen 11/13 but still not felling well.   Patient's daughter and fiance are sick as well. 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  Denies fever. Normal PO intake without n/v/d.  OBJECTIVE:  Vitals:   05/21/22 1022  BP: 101/67  Pulse: 83  Resp: 16  Temp: 98.6 F (37 C)  TempSrc: Oral  SpO2: 98%    General appearance: alert; no distress Eyes: PERRLA; EOMI; conjunctiva normal HENT: [place]; AT; with nasal congestion; throat with erythema/cobblestoning Neck: supple  Lungs: speaks full sentences without difficulty; unlabored Extremities: no edema Skin: warm and dry Neurologic: normal gait Psychological: alert and cooperative; normal mood and affect  Labs: Results for orders placed or performed during the hospital encounter of 05/21/22  SARS CORONAVIRUS 2 (TAT 6-24 HRS)  Anterior Nasal Swab   Specimen: Anterior Nasal Swab  Result Value Ref Range   SARS Coronavirus 2 NEGATIVE NEGATIVE  POCT Rapid Strep A  Result Value Ref Range   Streptococcus, Group A Screen (Direct) NEGATIVE NEGATIVE   Labs Reviewed  SARS CORONAVIRUS 2 (TAT 6-24 HRS)  POCT RAPID STREP A, ED / UC     Allergies  Allergen Reactions   Bee Pollen Hives   Pollen Extract     Past Medical History:  Diagnosis Date   Allergy    seasonal allergies   Eclampsia 09/02/2018   GERD (gastroesophageal reflux disease)    Gonorrhea 12/29/2019   History of heavy periods    HPV (human papilloma virus) infection    Ileus ([place]) 09/04/2018   Severe preeclampsia, third trimester 09/02/2018   Social History   Socioeconomic History   Marital status: Single    Spouse name: Not on file   Number of children: Not on file   Years of education: Not on file   Highest education level: Not on file  Occupational History   Not on file  Tobacco Use   Smoking status: Former    Types: Cigarettes   Smokeless tobacco: Never   Tobacco comments:    black and mild 4x week  Vaping Use   Vaping Use: Some days  Substance and Sexual Activity   Alcohol use: Never   Drug use: Yes    Types: Marijuana   Sexual activity: Not on file  Other Topics Concern   Not on file  Social History  Narrative   ** Merged History Encounter **       In foster (kinship) care with her biological paternal aunt and 3 of her 7 siblings   Social Determinants of Health   Financial Resource Strain: Not on file  Food Insecurity: Not on file  Transportation Needs: Not on file  Physical Activity: Not on file  Stress: Not on file  Social Connections: Not on file  Intimate Partner Violence: Not on file   Family History  Problem Relation Age of Onset   Hypertension Mother    Autism Sister    Autism Brother    Autism Sister    Autism Sister    ADD / ADHD Sister    Past Surgical History:  Procedure Laterality Date   CESAREAN  SECTION N/A 09/02/2018   Procedure: CESAREAN SECTION;  Surgeon: Willodean Rosenthal, MD;  Location: [facility];  Service: Obstetrics;  Laterality: N/A;     Mardella Layman, MD 05/24/22 1004

## 2022-06-30 ENCOUNTER — Emergency Department (HOSPITAL_COMMUNITY): Payer: Medicaid Other

## 2022-06-30 ENCOUNTER — Other Ambulatory Visit: Payer: Self-pay

## 2022-06-30 ENCOUNTER — Emergency Department (HOSPITAL_COMMUNITY)
Admission: EM | Admit: 2022-06-30 | Discharge: 2022-06-30 | Disposition: A | Discharge status: Home / Self Care | Payer: Medicaid Other | Attending: Emergency Medicine | Admitting: Emergency Medicine

## 2022-06-30 DIAGNOSIS — R110 Nausea: Secondary | ICD-10-CM | POA: Diagnosis not present

## 2022-06-30 DIAGNOSIS — R1013 Epigastric pain: Secondary | ICD-10-CM | POA: Insufficient documentation

## 2022-06-30 LAB — COMPREHENSIVE METABOLIC PANEL
ALT: 9 U/L (ref 0–44)
AST: 17 U/L (ref 15–41)
Albumin: 3.6 g/dL (ref 3.5–5.0)
Alkaline Phosphatase: 58 U/L (ref 38–126)
Anion gap: 8 (ref 5–15)
BUN: 11 mg/dL (ref 6–20)
CO2: 22 mmol/L (ref 22–32)
Calcium: 8.2 mg/dL — ABNORMAL LOW (ref 8.9–10.3)
Chloride: 110 mmol/L (ref 98–111)
Creatinine, Ser: 0.61 mg/dL (ref 0.44–1.00)
GFR, Estimated: >60 mL/min (ref >60)
Glucose, Bld: 81 mg/dL (ref 70–99)
Potassium: 3.9 mmol/L (ref 3.5–5.1)
Sodium: 140 mmol/L (ref 135–145)
Total Bilirubin: 0.8 mg/dL (ref 0.3–1.2)
Total Protein: 6.6 g/dL (ref 6.5–8.1)

## 2022-06-30 LAB — CBC WITH DIFFERENTIAL/PLATELET
Abs Immature Granulocytes: 0.01 10*3/uL (ref 0.00–0.07)
Basophils Absolute: 0 10*3/uL (ref 0.0–0.1)
Basophils Relative: 0 %
Eosinophils Absolute: 0 10*3/uL (ref 0.0–0.5)
Eosinophils Relative: 1 %
HCT: 35.9 % — ABNORMAL LOW (ref 36.0–46.0)
Hemoglobin: 11.5 g/dL — ABNORMAL LOW (ref 12.0–15.0)
Immature Granulocytes: 0 %
Lymphocytes Relative: 18 %
Lymphs Abs: 0.8 10*3/uL (ref 0.7–4.0)
MCH: 30.7 pg (ref 26.0–34.0)
MCHC: 32 g/dL (ref 30.0–36.0)
MCV: 96 fL (ref 80.0–100.0)
Monocytes Absolute: 0.3 10*3/uL (ref 0.1–1.0)
Monocytes Relative: 6 %
Neutro Abs: 3.3 10*3/uL (ref 1.7–7.7)
Neutrophils Relative %: 75 %
Platelets: 227 10*3/uL (ref 150–400)
RBC: 3.74 MIL/uL — ABNORMAL LOW (ref 3.87–5.11)
RDW: 14.6 % (ref 11.5–15.5)
WBC: 4.3 10*3/uL (ref 4.0–10.5)
nRBC: 0 % (ref 0.0–0.2)

## 2022-06-30 LAB — URINALYSIS, ROUTINE W REFLEX MICROSCOPIC
Bacteria, UA: NONE SEEN
Bilirubin Urine: NEGATIVE
Glucose, UA: NEGATIVE mg/dL
Ketones, ur: NEGATIVE mg/dL
Leukocytes,Ua: NEGATIVE
Nitrite: NEGATIVE
Protein, ur: NEGATIVE mg/dL
Specific Gravity, Urine: 1.02 (ref 1.005–1.030)
pH: 6 (ref 5.0–8.0)

## 2022-06-30 LAB — HCG, QUANTITATIVE, PREGNANCY: hCG, Beta Chain, Quant, S: <1 m[IU]/mL (ref <5)

## 2022-06-30 LAB — LIPASE, BLOOD: Lipase: 26 U/L (ref 11–51)

## 2022-06-30 MED ORDER — SODIUM CHLORIDE 0.9 % IV BOLUS
1000.0000 mL | Freq: Once | INTRAVENOUS | Status: AC
  Administered 2022-06-30: 1000 mL via INTRAVENOUS

## 2022-06-30 MED ORDER — IOHEXOL 9 MG/ML PO SOLN
500.0000 mL | ORAL | Status: AC
  Administered 2022-06-30: 1000 mL via ORAL

## 2022-06-30 MED ORDER — IOHEXOL 9 MG/ML PO SOLN
ORAL | Status: AC
  Administered 2022-06-30: 500 mL via ORAL
  Filled 2022-06-30: qty 1000

## 2022-06-30 MED ORDER — SUCRALFATE 1 G PO TABS: 1.0000 g | ORAL_TABLET | Freq: Three times a day (TID) | ORAL | 0 refills | Status: DC

## 2022-06-30 MED ORDER — IOHEXOL 300 MG/ML  SOLN
100.0000 mL | Freq: Once | INTRAMUSCULAR | Status: AC | PRN
  Administered 2022-06-30: 75 mL via INTRAVENOUS

## 2022-06-30 MED ORDER — KETOROLAC TROMETHAMINE 30 MG/ML IJ SOLN
15.0000 mg | Freq: Once | INTRAMUSCULAR | Status: AC
  Administered 2022-06-30: 15 mg via INTRAVENOUS
  Filled 2022-06-30: qty 1

## 2022-06-30 NOTE — Discharge Instructions (Addendum)
You have been seen today for your complaint of epigastric abdominal pain. Your lab work was reassuring and showed no abnormalities. Your imaging showed masses on both of your ovaries.  In the absence of pelvic pain or vaginal symptoms, you chose to follow-up with gynecology as an outpatient. Your discharge medications include Carafate.  This is a medication for your abdominal pain.  Take it as prescribed. Follow up with: Dr. Richardson Dopp. She is a Theatre manager. Call tomorrow to schedule an appointment for an ED follow up regarding your CT scan results. Please seek immediate medical care if you develop any of the following symptoms: Your pain does not go away as soon as your health care provider told you to expect. You cannot stop vomiting. Your pain is only in areas of the abdomen, such as the right side or the left lower portion of the abdomen. Pain on the right side could be caused by appendicitis. You have bloody or black stools, or stools that look like tar. You have severe pain, cramping, or bloating in your abdomen. You have signs of dehydration, such as: Dark urine, very little urine, or no urine. Cracked lips. Dry mouth. Sunken eyes. Sleepiness. Weakness. You have trouble breathing or chest pain. At this time there does not appear to be the presence of an emergent medical condition, however there is always the potential for conditions to change. Please read and follow the below instructions.  Do not take your medicine if  develop an itchy rash, swelling in your mouth or lips, or difficulty breathing; call 911 and seek immediate emergency medical attention if this occurs.  You may review your lab tests and imaging results in their entirety on your MyChart account.  Please discuss all results of fully with your primary care provider and other specialist at your follow-up visit.  Note: Portions of this text may have been transcribed using voice recognition software. Every effort was made to  ensure accuracy; however, inadvertent computerized transcription errors may still be present.

## 2022-06-30 NOTE — ED Triage Notes (Signed)
Pt BIB [facility] for abdominal pain - LUQ and epigastric pain. Pt has hx of IBS 110/64 BP 97 HR 99% 20rr

## 2022-06-30 NOTE — ED Notes (Signed)
Patient transported to CT 

## 2022-06-30 NOTE — ED Notes (Signed)
Second RN attempted to get IV access, unable to do so at this time

## 2022-06-30 NOTE — ED Provider Notes (Signed)
Physical Exam  BP (!) 78/59   Pulse 70   Temp 97.7 F (36.5 C) (Oral)   Resp 18   SpO2 98%   Physical Exam Vitals and nursing note reviewed.  Constitutional:      General: She is not in acute distress.    Appearance: Normal appearance. She is normal weight. She is not ill-appearing.     Comments: Resting comfortably in bed  HENT:     Head: Normocephalic and atraumatic.  Pulmonary:     Effort: Pulmonary effort is normal. No respiratory distress.  Abdominal:     General: Abdomen is flat.  Musculoskeletal:        General: Normal range of motion.     Cervical back: Neck supple.  Skin:    General: Skin is warm and dry.  Neurological:     Mental Status: She is alert and oriented to person, place, and time.  Psychiatric:        Mood and Affect: Mood normal.        Behavior: Behavior normal.    Procedures  Procedures  ED Course / MDM   Clinical Course as of 06/30/22 1831  Mon Jun 30, 2022  1520 26 yo, hx of IBS, has epigastric and suprapubic tenderness which is different than her normal pain. Check CT and DC if feeling better and no intra abdominal abnormalities [AS]  1829 Reevaluation shows patient has no abdominal pain. Only reporting having pain in the epigastric region, denies pelvic pain, urinary or vaginal symptoms, concerns for STIs. BP has remained low with systolic pressures between 90 and 100. Will order 2nd liter of fluids [AS]    Clinical Course User Index [AS] Toniya Rozar, Edsel Petrin, PA-C   Medical Decision Making Amount and/or Complexity of Data Reviewed Labs: ordered. Radiology: ordered.  Risk Prescription drug management.  Care assumed at shift change from [facility], [place].  See his note for full HPI.  In short, 26 year old female history of IBS who presents to the ED for evaluation of 3 days of epigastric and suprapubic pain.  Took Bentyl at home with minimal relief.  States that it feels different than her typical IBS pain.  So far, lab work and  physical exam is reassuring.  Her pain did improve with IM Toradol.  Awaiting CT abdomen pelvis for final disposition.  If this is normal, patient will be discharged with primary care provider follow-up.  1830-patient was notified of CT results.  Adamantly denying pelvic pain, urinary symptoms or vaginal symptoms.  States she would rather follow-up with gynecology outpatient and have further workup in the ED for this.  States her pain has resolved.  Still has soft pressures.  Will order second liter of fluids. Chart review shows that her BP usually runs low 100s over 70s. Readings are consistent back to August of this year  2000-patient still resting comfortably in bed.  She did get her second bag of fluids.  Most recent blood pressure reading 114/75.  This is near baseline.  Still does not have abdominal pain.  Will be discharged home with prescription for Carafate and follow-up with gynecology.  She was given contact admission for the on-call gynecologist.  She was given return precautions.  Stable at discharge.  At this time there does not appear to be any evidence of an acute emergency medical condition and the patient appears stable for discharge with appropriate outpatient follow up. Diagnosis was discussed with patient who verbalizes understanding of care plan and is agreeable  to discharge. I have discussed return precautions with patient who verbalizes understanding. Patient encouraged to follow-up with their PCP within 1 week. All questions answered.  Patient's case discussed with Dr. Wallace Cullens who agrees with plan to discharge with follow-up.   Note: Portions of this report may have been transcribed using voice recognition software. Every effort was made to ensure accuracy; however, inadvertent computerized transcription errors may still be present.     Michelle Piper, PA-C 06/30/22 2020    Sloan Leiter, DO 07/01/22 [phone]

## 2022-06-30 NOTE — ED Provider Notes (Signed)
[facility]-EMERGENCY DEPT Provider Note   CSN: [id number] Arrival date & time: 06/30/22  1203     History  Chief Complaint  Patient presents with   Abdominal Pain    Victoria Turner is a 26 y.o. female.   Abdominal Pain   26 year old female presents emergency department with complaints of abdominal pain.  Patient reports abdominal pain beginning approximately 3 days ago.  Reports history of IBS of which she takes Bentyl at home.  States that this pain is "different in nature" then IBS type pain.  Reports epigastric as well as suprapubic pain without radiation.  Pain is not worsened with consumption of food.  Reports associated nausea without emesis.  Denies fever, chills, night sweats, chest pain, shortness of breath, urinary/vaginal symptoms, change in bowel habits.  Past medical history significant for GERD, preeclampsia, STI  Home Medications Prior to Admission medications   Medication Sig Start Date End Date Taking? Authorizing Provider  acetaminophen (TYLENOL) 325 MG tablet Take 2 tablets (650 mg total) by mouth every 6 (six) hours as needed. 11/11/21   Sloan Leiter, DO  dicyclomine (BENTYL) 10 MG capsule Take 1 capsule (10 mg total) by mouth 4 (four) times daily -  before meals and at bedtime. 05/19/22   Raspet, Noberto Retort, PA-C  emtricitabine-tenofovir AF (DESCOVY) 200-25 MG tablet Take 1 tablet by mouth daily. Patient not taking: Reported on 05/19/2022 02/27/22   Crissie Sickles, MD  famotidine (PEPCID) 20 MG tablet Take 1 tablet (20 mg total) by mouth 2 (two) times daily. Patient not taking: Reported on 05/19/2022 04/09/21   Merrilee Jansky, MD  ketorolac (ACULAR) 0.5 % ophthalmic solution Place 1 drop into both eyes every 6 (six) hours. Patient not taking: Reported on 05/19/2022 01/27/22   Valinda Hoar, NP  magic mouthwash (lidocaine, diphenhydrAMINE, alum & mag hydroxide) suspension Swish and spit 5 mLs 4 (four) times daily as needed for  mouth pain. 05/21/22   Mardella Layman, MD  promethazine-dextromethorphan (PROMETHAZINE-DM) 6.25-15 MG/5ML syrup Take 5 mLs by mouth 4 (four) times daily as needed for cough. 05/21/22   Mardella Layman, MD  psyllium ([employer] FIBER) 0.52 g capsule Take 1 capsule (0.52 g total) by mouth daily. 05/19/22   Raspet, Noberto Retort, PA-C  sertraline (ZOLOFT) 25 MG tablet Take 1 tablet (25 mg total) by mouth daily. Patient not taking: Reported on 05/19/2022 12/29/19   Claudean Severance, MD  norethindrone (MICRONOR,CAMILA,ERRIN) 0.35 MG tablet Take 1 tablet (0.35 mg total) by mouth daily. Patient not taking: Reported on 08/10/2019 10/06/18 11/18/19  Conan Bowens, MD      Allergies    Bee pollen and Pollen extract    Review of Systems   Review of Systems  Gastrointestinal:  Positive for abdominal pain.  All other systems reviewed and are negative.   Physical Exam Updated Vital Signs BP (!) 115/92   Pulse 89   Temp 97.7 F (36.5 C) (Oral)   Resp (!) 25   SpO2 100%  Physical Exam Vitals and nursing note reviewed.  Constitutional:      General: She is not in acute distress.    Appearance: She is well-developed.  HENT:     Head: Normocephalic and atraumatic.  Eyes:     Conjunctiva/sclera: Conjunctivae normal.  Cardiovascular:     Rate and Rhythm: Normal rate and regular rhythm.     Heart sounds: No murmur heard. Pulmonary:     Effort: Pulmonary effort is normal. No respiratory distress.  Breath sounds: Normal breath sounds.  Abdominal:     Palpations: Abdomen is soft.     Tenderness: There is abdominal tenderness in the epigastric area and suprapubic area.  Musculoskeletal:        General: No swelling.     Cervical back: Neck supple.  Skin:    General: Skin is warm and dry.     Capillary Refill: Capillary refill takes less than 2 seconds.  Neurological:     Mental Status: She is alert.  Psychiatric:        Mood and Affect: Mood normal.     ED Results / Procedures / Treatments   Labs (all  labs ordered are listed, but only abnormal results are displayed) Labs Reviewed  CBC WITH DIFFERENTIAL/PLATELET - Abnormal; Notable for the following components:      Result Value   RBC 3.74 (*)    Hemoglobin 11.5 (*)    HCT 35.9 (*)    All other components within normal limits  URINALYSIS, ROUTINE W REFLEX MICROSCOPIC - Abnormal; Notable for the following components:   Hgb urine dipstick MODERATE (*)    All other components within normal limits  COMPREHENSIVE METABOLIC PANEL - Abnormal; Notable for the following components:   Calcium 8.2 (*)    All other components within normal limits  LIPASE, BLOOD  HCG, QUANTITATIVE, PREGNANCY  I-STAT BETA HCG BLOOD, ED ([facility], [facility], [facility] ONLY)    EKG None  Radiology No results found.  Procedures Procedures    Medications Ordered in ED Medications  sodium chloride 0.9 % bolus 1,000 mL (0 mLs Intravenous Stopped 06/30/22 1346)  ketorolac (TORADOL) 30 MG/ML injection 15 mg (15 mg Intravenous Given 06/30/22 1245)  iohexol (OMNIPAQUE) 9 MG/ML oral solution 500 mL (500 mLs Oral Contrast Given 06/30/22 1343)    ED Course/ Medical Decision Making/ A&P Clinical Course as of 06/30/22 1542  Mon Jun 30, 2022  1520 26 yo, hx of IBS, has epigastric and suprapubic tenderness which is different than her normal pain. Check CT and DC if feeling better and no intra abdominal abnormalities [AS]    Clinical Course User Index [AS] Schutt, Edsel Petrin, PA-C                           Medical Decision Making Amount and/or Complexity of Data Reviewed Labs: ordered. Radiology: ordered.  Risk Prescription drug management.   This patient presents to the ED for concern of abdominal pain, this involves an extensive number of treatment options, and is a complaint that carries with it a high risk of complications and morbidity.  The differential diagnosis includes gastritis, PUD, pancreatitis, CBD pathology, cholecystitis, SBO/LBO, urinary tract infection,  pyelonephritis, nephrolithiasis, diverticulitis, appendicitis, volvulus, ovarian torsion, ectopic pregnancy, PID   Co morbidities that complicate the patient evaluation  See HPI   Additional history obtained:  Additional history obtained from EMR External records from outside source obtained and reviewed including hospital records   Lab Tests:  I Ordered, and personally interpreted labs.  The pertinent results include: No leukocytosis.  Mild evidence of anemia with a hemoglobin 11.5 of which is near patient's baseline from prior labs are performed.  No electrolyte abnormalities appreciated.  No transaminitis.  No renal dysfunction.  UA significant for moderate hemoglobin otherwise within normal range.  hCG quant negative.  Lipase within normal limits.   Imaging Studies ordered:  I ordered imaging studies including CT abdomen pelvis which is pending upon shift change  Cardiac Monitoring: / EKG:  The patient was maintained on a cardiac monitor.  I personally viewed and interpreted the cardiac monitored which showed an underlying rhythm of: Sinus rhythm   Consultations Obtained:  N/a   Problem List / ED Course / Critical interventions / Medication management  Abdominal pain I ordered medication including Toradol for pain, 1 L normal saline for rehydration.   Reevaluation of the patient after these medicines showed that the patient improved I have reviewed the patients home medicines and have made adjustments as needed   Social Determinants of Health:  Former cigarette use.  Denies illicit drug use.   Test / Admission - Considered:  Abdominal pain Vitals signs within normal range and stable throughout visit. Laboratory/imaging studies significant for: See above Patient with overall reassuring laboratory studies as well as improving of symptoms and response to Toradol.  Awaiting CT of the abdomen and pelvis to assess for other intra-abdominal pathology.  Awaiting CT  abdomen pelvis to determine disposition.  At shift change, patient care handed off to [facility].  Patient stable upon shift change.        Final Clinical Impression(s) / ED Diagnoses Final diagnoses:  None    Rx / DC Orders ED Discharge Orders     None         Peter Garter, [place] 06/30/22 1542    Alvira Monday, MD 07/01/22 0003

## 2022-07-01 ENCOUNTER — Telehealth: Payer: Self-pay | Admitting: *Deleted

## 2022-07-01 NOTE — Telephone Encounter (Signed)
Transition Care Management Unsuccessful Follow-up Telephone Call  Date of discharge and from where:  06/30/2022 [facility]  Attempts:  1st Attempt  Reason for unsuccessful TCM follow-up call:  Missing or invalid number

## 2022-07-03 ENCOUNTER — Telehealth: Payer: Self-pay | Admitting: *Deleted

## 2022-07-03 NOTE — Telephone Encounter (Signed)
Transition Care Management Unsuccessful Follow-up Telephone Call  Date of discharge and from where:  06/30/2022 [facility]  Attempts:  2nd Attempt  Reason for unsuccessful TCM follow-up call:  Missing or invalid number

## 2022-07-04 ENCOUNTER — Telehealth: Payer: Self-pay | Admitting: *Deleted

## 2022-07-04 NOTE — Telephone Encounter (Signed)
Transition Care Management Unsuccessful Follow-up Telephone Call  Date of discharge and from where:  06/30/2022 [facility]  Attempts:  3rd Attempt  Reason for unsuccessful TCM follow-up call:  Missing or invalid number

## 2022-07-30 ENCOUNTER — Other Ambulatory Visit: Payer: Self-pay

## 2022-07-30 ENCOUNTER — Ambulatory Visit: Payer: Medicaid Other | Admitting: Student

## 2022-07-30 ENCOUNTER — Other Ambulatory Visit (HOSPITAL_COMMUNITY)
Admission: RE | Admit: 2022-07-30 | Discharge: 2022-07-30 | Disposition: A | Discharge status: Home / Self Care | Payer: Medicaid Other | Source: Ambulatory Visit | Attending: Internal Medicine | Admitting: Internal Medicine

## 2022-07-30 ENCOUNTER — Encounter: Payer: Self-pay | Admitting: Student

## 2022-07-30 VITALS — BP 97/66 | HR 84 | Temp 98.3°F | Ht 62.0 in | Wt 95.8 lb

## 2022-07-30 DIAGNOSIS — N83202 Unspecified ovarian cyst, left side: Secondary | ICD-10-CM

## 2022-07-30 DIAGNOSIS — K625 Hemorrhage of anus and rectum: Secondary | ICD-10-CM

## 2022-07-30 DIAGNOSIS — N926 Irregular menstruation, unspecified: Secondary | ICD-10-CM

## 2022-07-30 DIAGNOSIS — Z8619 Personal history of other infectious and parasitic diseases: Secondary | ICD-10-CM | POA: Insufficient documentation

## 2022-07-30 DIAGNOSIS — Z23 Encounter for immunization: Secondary | ICD-10-CM

## 2022-07-30 DIAGNOSIS — R569 Unspecified convulsions: Secondary | ICD-10-CM | POA: Diagnosis not present

## 2022-07-30 DIAGNOSIS — F32A Depression, unspecified: Secondary | ICD-10-CM

## 2022-07-30 DIAGNOSIS — Z3202 Encounter for pregnancy test, result negative: Secondary | ICD-10-CM | POA: Diagnosis not present

## 2022-07-30 DIAGNOSIS — N9489 Other specified conditions associated with female genital organs and menstrual cycle: Secondary | ICD-10-CM | POA: Diagnosis not present

## 2022-07-30 DIAGNOSIS — N83201 Unspecified ovarian cyst, right side: Secondary | ICD-10-CM | POA: Diagnosis not present

## 2022-07-30 LAB — POCT URINE PREGNANCY: Preg Test, Ur: NEGATIVE

## 2022-07-30 MED ORDER — LEVETIRACETAM 250 MG PO TABS: 500.0000 mg | ORAL_TABLET | Freq: Every day | ORAL | 0 refills | Status: DC

## 2022-07-30 MED ORDER — DESCOVY 200-25 MG PO TABS: 1.0000 | ORAL_TABLET | Freq: Every day | ORAL | 7 refills | Status: DC

## 2022-07-30 MED ORDER — SERTRALINE HCL 25 MG PO TABS: 25.0000 mg | ORAL_TABLET | Freq: Every day | ORAL | 5 refills | Status: DC

## 2022-07-30 NOTE — Patient Instructions (Addendum)
You can use over the counter cream like preparation H.  Make sure you drink lots of water, and get lots of fiber. Miralax.   Please take your keppra for seizures. If you do not hear from neurology in 2 weeks please give our office a call.

## 2022-07-31 LAB — URINE CYTOLOGY ANCILLARY ONLY
Chlamydia: NEGATIVE
Comment: NEGATIVE
Comment: NORMAL
Neisseria Gonorrhea: NEGATIVE

## 2022-07-31 LAB — HIV ANTIBODY (ROUTINE TESTING W REFLEX): HIV Screen 4th Generation wRfx: NONREACTIVE

## 2022-07-31 NOTE — Progress Notes (Signed)
   CC: F/u BRBPR for 3d  HPI:  [name] is a 27 y.o. F with PMH per below who presents for rectal bleeding. Patient states that she first noticed blood in her stools ~3d prior to today's clinic visit. She noticed the blood in the toilet bowl and on her toilet paper and states that it was red. Her last bowel movement was just prior to her clinic visit and she had blood in that stool as well. She does note constipation over the last several days. She denies fever, chills, abdominal pain or rectal pain. She has not had this in the past. She does not take ibuprofen, denies GERD symptoms or dysphagia. Her last menses was about a month ago. She also denies h/o IBD or FH of GI cancers/IBD.    Past Medical History:  Diagnosis Date   Allergy    seasonal allergies   Eclampsia 09/02/2018   GERD (gastroesophageal reflux disease)    Gonorrhea 12/29/2019   History of heavy periods    HPV (human papilloma virus) infection    Ileus ([place]) 09/04/2018   Severe preeclampsia, third trimester 09/02/2018   Review of Systems:  Negative except per above  Physical Exam:  Vitals:   07/30/22 1515  BP: 97/66  Pulse: 84  Temp: 98.3 F (36.8 C)  TempSrc: Oral  SpO2: 100%  Weight: 95 lb 12.8 oz (43.5 kg)  Height: 5\' 2"  (1.575 m)    Constitutional: Thin, Well-developed, well-nourished, and in no distress.  Cardiovascular: Normal rate, regular rhythm, intact distal pulses. No gallop and no friction rub.  No murmur heard. No lower extremity edema  Pulmonary: Non labored breathing on room air, no wheezing or rales  Abdominal: Soft. Normal bowel sounds. Non distended and non tender Musculoskeletal: Normal range of motion.        General: No tenderness or edema.  Neurological: Alert and oriented to person, place, and time. Non focal  Skin: Skin is warm and dry.  Anorectal: No fissures or trauma to external tissue, no palpable hemorrhoids   Assessment & Plan:   See Encounters Tab for  problem based charting.  Patient discussed with Dr. Heber [place]

## 2022-07-31 NOTE — Progress Notes (Signed)
Patient filled out PHQ9 and stated that she has on several days feeling of wanting to hurt herself or thoughts that she would be better off dead. She currently does not have these feelings. She states last year she had these feelings and actually had a plan. She did not go to the hospital for this. She states she had been off of her depression medications for awhile and feels this precipitated her feelings.   She states that if she did feel like she wanted to harm herself or had a plan that she would call her Godmother or the grandmother of her child. She also states that she would call the emergency room if she had a plan.   Patient states that she is open to participating in therapy.    Discussed with patient that she should feel free to call our office at any time if she has these symptoms again. Also stated that she can call our on call team as well.

## 2022-08-01 DIAGNOSIS — K625 Hemorrhage of anus and rectum: Secondary | ICD-10-CM | POA: Insufficient documentation

## 2022-08-01 DIAGNOSIS — N9489 Other specified conditions associated with female genital organs and menstrual cycle: Secondary | ICD-10-CM | POA: Insufficient documentation

## 2022-08-01 NOTE — Assessment & Plan Note (Signed)
Recent CT A/P 12/25 showed: Reproductive: Anteverted uterus. Low-density structures within both adnexa measuring up to 2.4 cm on the left and 2.2 cm on the right, both of which demonstrate greater than simple fluid density.  -Will refer patient to gyn, and order TVUS as recommended by radiology for follow up of this.

## 2022-08-01 NOTE — Assessment & Plan Note (Addendum)
Patient reports constipation and BRBPR for the past 3 d. She states that although she notices blood in the toilet bowl and on her TP, she bleeds only a small amount of blood. Given her report of constipation, she could have hemorrhoids. Bleed is small and patient is young, less suspicion for diverticular bleed. She has not trauma in her anogenital region making anal fissure less likely, she also has no pain. She is not on her menses so this is not the culprit of her symptoms.  -Encouraged patient to increase fiber intake, including taking miralax daily -Also discussed using preparation H if she develops an itchy or burning sensation in her perineal/anal region Discussed she should make a follow up appointment if this does not resolve her symptoms.

## 2022-08-01 NOTE — Assessment & Plan Note (Addendum)
Patient states that this is poorly controlled. She states she has been unable to get a refill of her sertraline. When she was on this she felt that it helped her symptoms. She did note some SI but has no active plan. We discussed what she would do if she had these feelings again. She agreed to call her godmother or go to the emergency room if she had an actual plan.  -Restart sertraline -Refer patient to behavioral health with [facility] Port[facility] -Patient will come back in 1 week for apopintment

## 2022-08-01 NOTE — Assessment & Plan Note (Signed)
Patient continues to have frequent seizures. She reports limb shaking, her eyelids rolling in the back of her head and a postictal state according to her significant other. Her last seizure was in 06/2022. She has not been evaluated by neurology. Patient is not on any medications that could lower her seizure threshold. She has no lab abnormalities on recent labs at the hospital. She rarely drinks alcohol and when she does she has a "couple of shots"  -Start keppra -Refer to neurology

## 2022-08-04 ENCOUNTER — Telehealth: Payer: Self-pay | Admitting: Student

## 2022-08-04 ENCOUNTER — Telehealth: Payer: Self-pay

## 2022-08-04 DIAGNOSIS — K625 Hemorrhage of anus and rectum: Secondary | ICD-10-CM

## 2022-08-04 NOTE — Telephone Encounter (Signed)
Patient still having BRBPR with [facility]. Discussed that she needs further evaluation in the GI office, as there are in office procedure they can perform that we are not able to.   She is agreeable with this.   Referral sent.   Patient also notes she is still having white vaginal discharge. Patient neg for GC during last clinic visit.   -She will schedule an appointment, will check for trich, yeast, and BV at that time.

## 2022-08-04 NOTE — Progress Notes (Signed)
Internal Medicine Clinic Attending  Case discussed with the resident at the time of the visit.  We reviewed the resident's history and exam and pertinent patient test results.  I agree with the assessment, diagnosis, and plan of care documented in the resident's note.  

## 2022-08-04 NOTE — Telephone Encounter (Signed)
Pt states she is still experiencing blood in the stool. Please call pt back.

## 2022-08-07 ENCOUNTER — Other Ambulatory Visit (HOSPITAL_COMMUNITY)
Admission: RE | Admit: 2022-08-07 | Discharge: 2022-08-07 | Disposition: A | Discharge status: Home / Self Care | Payer: Medicaid Other | Source: Ambulatory Visit | Attending: Internal Medicine | Admitting: Internal Medicine

## 2022-08-07 ENCOUNTER — Ambulatory Visit (INDEPENDENT_AMBULATORY_CARE_PROVIDER_SITE_OTHER): Payer: Medicaid Other | Admitting: Student

## 2022-08-07 ENCOUNTER — Encounter: Payer: Self-pay | Admitting: Student

## 2022-08-07 ENCOUNTER — Other Ambulatory Visit: Payer: Self-pay

## 2022-08-07 ENCOUNTER — Institutional Professional Consult (permissible substitution): Payer: Medicaid Other | Admitting: Licensed Clinical Social Worker

## 2022-08-07 VITALS — BP 108/65 | HR 113 | Temp 98.4°F | Ht 62.0 in | Wt 94.0 lb

## 2022-08-07 DIAGNOSIS — R3589 Other polyuria: Secondary | ICD-10-CM | POA: Diagnosis not present

## 2022-08-07 DIAGNOSIS — K625 Hemorrhage of anus and rectum: Secondary | ICD-10-CM | POA: Diagnosis not present

## 2022-08-07 DIAGNOSIS — B9689 Other specified bacterial agents as the cause of diseases classified elsewhere: Secondary | ICD-10-CM | POA: Diagnosis not present

## 2022-08-07 DIAGNOSIS — R000 Tachycardia, unspecified: Secondary | ICD-10-CM | POA: Insufficient documentation

## 2022-08-07 DIAGNOSIS — N898 Other specified noninflammatory disorders of vagina: Secondary | ICD-10-CM | POA: Insufficient documentation

## 2022-08-07 DIAGNOSIS — N760 Acute vaginitis: Secondary | ICD-10-CM | POA: Diagnosis present

## 2022-08-07 DIAGNOSIS — Z8619 Personal history of other infectious and parasitic diseases: Secondary | ICD-10-CM | POA: Diagnosis present

## 2022-08-07 DIAGNOSIS — Z3202 Encounter for pregnancy test, result negative: Secondary | ICD-10-CM

## 2022-08-07 DIAGNOSIS — Z3200 Encounter for pregnancy test, result unknown: Secondary | ICD-10-CM

## 2022-08-07 HISTORY — DX: Other polyuria: R35.89

## 2022-08-07 HISTORY — DX: Tachycardia, unspecified: R00.0

## 2022-08-07 LAB — CERVICOVAGINAL ANCILLARY ONLY
Bacterial Vaginitis (gardnerella): POSITIVE — AB
Candida Glabrata: NEGATIVE
Candida Vaginitis: NEGATIVE
Comment: NEGATIVE
Comment: NEGATIVE
Comment: NEGATIVE
Comment: NEGATIVE
Trichomonas: NEGATIVE

## 2022-08-07 LAB — HCG, SERUM, QUALITATIVE: Preg, Serum: NEGATIVE

## 2022-08-07 LAB — POCT URINE PREGNANCY: Preg Test, Ur: NEGATIVE

## 2022-08-07 NOTE — Progress Notes (Signed)
Subjective:  CC: follow up  HPI:  [name] is a 27 y.o. female with a past medical history stated below and presents today for vaginal discharge, BRBPR, and pregnancy concern. Please see problem based assessment and plan for additional details.  Past Medical History:  Diagnosis Date   Allergy    seasonal allergies   Eclampsia 09/02/2018   GERD (gastroesophageal reflux disease)    Gonorrhea 12/29/2019   History of heavy periods    HPV (human papilloma virus) infection    Ileus ([place]) 09/04/2018   Severe preeclampsia, third trimester 09/02/2018    Current Outpatient Medications on File Prior to Visit  Medication Sig Dispense Refill   acetaminophen (TYLENOL) 325 MG tablet Take 2 tablets (650 mg total) by mouth every 6 (six) hours as needed. 36 tablet 0   dicyclomine (BENTYL) 10 MG capsule Take 1 capsule (10 mg total) by mouth 4 (four) times daily -  before meals and at bedtime. 28 capsule 0   emtricitabine-tenofovir AF (DESCOVY) 200-25 MG tablet Take 1 tablet by mouth daily. 30 tablet 7   levETIRAcetam (KEPPRA) 250 MG tablet Take 2 tablets (500 mg total) by mouth daily. 30 tablet 0   sertraline (ZOLOFT) 25 MG tablet Take 1 tablet (25 mg total) by mouth daily. 30 tablet 5   [DISCONTINUED] norethindrone (MICRONOR,CAMILA,ERRIN) 0.35 MG tablet Take 1 tablet (0.35 mg total) by mouth daily. (Patient not taking: Reported on 08/10/2019) 1 Package 11   No current facility-administered medications on file prior to visit.    Family History  Problem Relation Age of Onset   Hypertension Mother    Autism Sister    Autism Brother    Autism Sister    Autism Sister    ADD / ADHD Sister     Social History   Socioeconomic History   Marital status: Single    Spouse name: Not on file   Number of children: Not on file   Years of education: Not on file   Highest education level: Not on file  Occupational History   Not on file  Tobacco Use   Smoking status: Some Days     Types: Cigarettes   Smokeless tobacco: Never  Vaping Use   Vaping Use: Some days  Substance and Sexual Activity   Alcohol use: Never   Drug use: Yes    Types: Marijuana   Sexual activity: Not on file  Other Topics Concern   Not on file  Social History Narrative   ** Merged History Encounter **       In foster (kinship) care with her biological paternal aunt and 3 of her 7 siblings   Social Determinants of Health   Financial Resource Strain: Low Risk  (07/31/2022)   Overall Financial Resource Strain (CARDIA)    Difficulty of Paying Living Expenses: Not hard at all  Food Insecurity: No Food Insecurity (07/31/2022)   Hunger Vital Sign    Worried About Running Out of Food in the Last Year: Never true    [place] in the Last Year: Never true  Transportation Needs: No Transportation Needs (07/31/2022)   PRAPARE - Hydrologist (Medical): No    Lack of Transportation (Non-Medical): No  Physical Activity: Not on file  Stress: Not on file  Social Connections: Socially Isolated (08/07/2022)   Social Connection and Isolation Panel [NHANES]    Frequency of Communication with Friends and Family: Never    Frequency of Social  Gatherings with Friends and Family: Never    Attends Religious Services: More than 4 times per year    Active Member of [employer] or Organizations: No    Attends Archivist Meetings: Never    Marital Status: Never married  Intimate Partner Violence: Not At Risk (07/31/2022)   Humiliation, Afraid, Rape, and Kick questionnaire    Fear of Current or Ex-Partner: No    Emotionally Abused: No    Physically Abused: No    Sexually Abused: No    Review of Systems: ROS negative except for what is noted on the assessment and plan.  Objective:   Vitals:   08/07/22 1040  BP: 108/65  Pulse: (!) 113  Temp: 98.4 F (36.9 C)  TempSrc: Oral  SpO2: 99%  Weight: 94 lb (42.6 kg)  Height: 5\' 2"  (1.575 m)    Physical  Exam: Constitutional: well-appearing woman sitting in chair, in no acute distress HENT: normocephalic atraumatic, mucous membranes moist Eyes: conjunctiva non-erythematous Neck: supple Cardiovascular: regular rate and rhythm, no m/r/g Pulmonary/Chest: normal work of breathing on room air, lungs clear to auscultation bilaterally Abdominal: soft, non-tender, non-distended MSK: normal bulk and tone Neurological: alert & oriented x 3, 5/5 strength in bilateral upper and lower extremities, normal gait Skin: warm and dry Psych: Pleasant mood and affect     Assessment & Plan:   History of sexually transmitted disease Patient returns to clinic with a 4-week history of clear and intermittently whitish vaginal discharge. She was seen in clinic on 1/24, urinary G/C negative. Symptoms have persisted. No foul smells, pruritus, erythema, or edema. No dyspareunia. No pelvic tenderness.  -cervicovaginal swab today   Encounter for pregnancy test Patient's concerned that her period is late as she has unprotected sex, wanting second pregnancy. She requests both urine and serum pregnancy tests -Both resulted and are negative. Patient aware  BRBPR (bright red blood per rectum) Continues to see blood streaks on stools. Stool frequency 4-7/daily. No pain associated prior to or with defecation, no, medium-to-hard stool consistency. Prior OV rectal exam without visualization of external hemorrhoid or fissures.  -referral to GI placed; awaiting insurance approval -will check TSH as patient has low BMI and stool frequency  Polyuria Patient presenting with frequent episodes of urination during day and nighttime not associated with dysuria, pelvic pressure, or changes in urine color. Her prior random glucose checks have been <100. Will check U/A today to assess UTI  Tachycardia, unspecified Tachycardic to 113 on presentation. Negative orthostatic signs. Patient drank 500 ml water and repeat HR to 60. Suspect  initial anxiety vs exertion. Now resolved -Patient to continue hydration -Continue to monitor   Patient discussed with Dr. Veatrice Kells, MD - PGY-1

## 2022-08-07 NOTE — Assessment & Plan Note (Signed)
Patient presenting with frequent episodes of urination during day and nighttime not associated with dysuria, pelvic pressure, or changes in urine color. Her prior random glucose checks have been <100. Will check U/A today to assess UTI

## 2022-08-07 NOTE — Patient Instructions (Addendum)
Thank you, Ms.Shonna Harrington-Stewart for allowing [place] to provide your care today. Today we discussed   Pregnancy test   Your heart rate normalized after drinking water - please continue to stay hydrated  Your stools and the redstreaks in it - keep an eye out for your stomach doctor referral  You can continue taking BOTH the zoloft and the medication to reduce your risk of HIV.     I have ordered the following labs for you:   Lab Orders         TSH         UA/M w/rflx Culture, Comp         Pregnancy Test, Serum, Qual         POCT Urine Pregnancy      I will call if any are abnormal. All of your labs can be accessed through "My Chart".   My Chart Access: [email or web address]  Please follow-up in the next few weeks if no improvement  Please make sure to arrive 15 minutes prior to your next appointment. If you arrive late, you may be asked to reschedule.    We look forward to seeing you next time. Please call our clinic at [phone] if you have any questions or concerns. The best time to call is Monday-Friday from 9am-4pm, but there is someone available 24/7. If after hours or the weekend, call the main hospital number and ask for the Internal Medicine Resident On-Call. If you need medication refills, please notify your pharmacy one week in advance and they will send [place] a request.   Thank you for letting us take part in your care. Wishing you the best!  Romana Juniper, MD 08/07/2022, 11:33 AM [facility] Resident, PGY-1

## 2022-08-07 NOTE — Assessment & Plan Note (Signed)
Continues to see blood streaks on stools. Stool frequency 4-7/daily. No pain associated prior to or with defecation, no, medium-to-hard stool consistency. Prior OV rectal exam without visualization of external hemorrhoid or fissures.  -referral to GI placed; awaiting insurance approval -will check TSH as patient has low BMI and stool frequency

## 2022-08-07 NOTE — Assessment & Plan Note (Signed)
Patient's concerned that her period is late as she has unprotected sex, wanting second pregnancy. She requests both urine and serum pregnancy tests -Both resulted and are negative. Patient aware

## 2022-08-07 NOTE — Assessment & Plan Note (Addendum)
Patient returns to clinic with a 4-week history of clear and intermittently whitish vaginal discharge. She was seen in clinic on 1/24, urinary G/C negative. Symptoms have persisted. No foul smells, pruritus, erythema, or edema. No dyspareunia. No pelvic tenderness.  -cervicovaginal swab today

## 2022-08-07 NOTE — Assessment & Plan Note (Signed)
Tachycardic to 113 on presentation. Negative orthostatic signs. Patient drank 500 ml water and repeat HR to 60. Suspect initial anxiety vs exertion. Now resolved -Patient to continue hydration -Continue to monitor

## 2022-08-08 LAB — MICROSCOPIC EXAMINATION
Casts: NONE SEEN /lpf
Epithelial Cells (non renal): >10 /hpf — AB (ref 0–10)
RBC, Urine: NONE SEEN /hpf (ref 0–2)

## 2022-08-08 LAB — UA/M W/RFLX CULTURE, COMP
Bilirubin, UA: NEGATIVE
Glucose, UA: NEGATIVE
Leukocytes,UA: NEGATIVE
Nitrite, UA: NEGATIVE
RBC, UA: NEGATIVE
Specific Gravity, UA: 1.028 (ref 1.005–1.030)
Urobilinogen, Ur: 1 mg/dL (ref 0.2–1.0)
pH, UA: 6.5 (ref 5.0–7.5)

## 2022-08-08 MED ORDER — METRONIDAZOLE 500 MG PO TABS: 500.0000 mg | ORAL_TABLET | Freq: Two times a day (BID) | ORAL | 0 refills | Status: AC

## 2022-08-08 NOTE — Addendum Note (Signed)
Addended by: Romana Juniper on: 08/08/2022 08:28 AM   Modules accepted: Orders

## 2022-08-08 NOTE — Progress Notes (Signed)
Negative for leukocytes, nitrites, WBC - low clinical suspicion for UTI. Epithelial, non renal cells - suspect dirty catch. Recent CMP wnl.

## 2022-08-08 NOTE — Progress Notes (Signed)
Negative pregnancy test +Bacterial vaginitis - sent prescription for flagyl; patient in agreement

## 2022-08-08 NOTE — Progress Notes (Signed)
Internal Medicine Clinic Attending  Case discussed with Dr.  Gomez   At the time of the visit.  We reviewed the resident's history and exam and pertinent patient test results.  I agree with the assessment, diagnosis, and plan of care documented in the resident's note.  

## 2022-08-09 LAB — TSH: TSH: 0.543 u[IU]/mL (ref 0.450–4.500)

## 2022-08-11 NOTE — Progress Notes (Signed)
Within normal levels. Patient will be contacted via [place]

## 2022-08-12 ENCOUNTER — Ambulatory Visit (INDEPENDENT_AMBULATORY_CARE_PROVIDER_SITE_OTHER): Payer: Medicaid Other | Admitting: Licensed Clinical Social Worker

## 2022-08-12 DIAGNOSIS — F32A Depression, unspecified: Secondary | ICD-10-CM

## 2022-08-12 NOTE — Patient Instructions (Signed)
Visit Information  Thank you for taking time to visit with me today. Please don't hesitate to contact me if I can be of assistance to you.     Our next appointment is in-person at @PCP @'s office on 02/08 at 9am  Please call [phone]  if you need to cancel or reschedule your appointment.   If you are experiencing a Mental Health or [place] or need someone to talk to, please call the Suicide and Crisis Lifeline: 988 call 1-800-273-TALK (toll free, 24 hour hotline) go to [facility] [place] [phone]) call 911   Patient verbalizes understanding of instructions and care plan provided today and agrees to view in [place]. Active MyChart status and patient understanding of how to access instructions and care plan via MyChart confirmed with patient.       Milus Height, MSW, [place]  [facility] Direct Dial:[phone]  Fax [phone] Main Office Phone: [phone] [place], [place] [address] Website: [place], [place]
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
## 2022-08-14 ENCOUNTER — Encounter: Payer: Self-pay | Admitting: Licensed Clinical Social Worker

## 2022-08-14 ENCOUNTER — Ambulatory Visit: Payer: Medicaid Other | Admitting: Licensed Clinical Social Worker

## 2022-08-14 NOTE — Telephone Encounter (Signed)
[facility] sent patient an email as patient preference of communication. Victoria Turner received no response. Patient was a no show to appointment. Patient will need to reschedule appointment.   "Hi Victoria Turner!  I just wanted to check in on you. We are scheduled for 9am this morning. Let me know if you need to reschedule.   Thanks!"  Victoria Turner, MSW, [place]  [facility] Direct Dial:[phone]  Fax [phone] Main Office Phone: [phone] [address] [place], [place] [address] Website: [place], [place]

## 2022-08-27 ENCOUNTER — Ambulatory Visit (HOSPITAL_COMMUNITY): Payer: Medicaid Other

## 2022-09-01 ENCOUNTER — Ambulatory Visit: Payer: Medicaid Other | Admitting: Obstetrics and Gynecology

## 2022-09-02 ENCOUNTER — Ambulatory Visit: Payer: Medicaid Other | Admitting: Neurology

## 2022-09-02 ENCOUNTER — Encounter: Payer: Self-pay | Admitting: Neurology

## 2023-02-27 ENCOUNTER — Emergency Department (HOSPITAL_COMMUNITY)
Admission: EM | Admit: 2023-02-27 | Discharge: 2023-02-28 | Discharge status: Against Medical Advice | Payer: Medicaid Other | Attending: Emergency Medicine | Admitting: Emergency Medicine

## 2023-02-27 ENCOUNTER — Emergency Department (HOSPITAL_COMMUNITY): Payer: Medicaid Other

## 2023-02-27 ENCOUNTER — Other Ambulatory Visit: Payer: Self-pay

## 2023-02-27 DIAGNOSIS — R059 Cough, unspecified: Secondary | ICD-10-CM | POA: Diagnosis present

## 2023-02-27 DIAGNOSIS — U071 COVID-19: Secondary | ICD-10-CM | POA: Insufficient documentation

## 2023-02-27 DIAGNOSIS — Z5321 Procedure and treatment not carried out due to patient leaving prior to being seen by health care provider: Secondary | ICD-10-CM | POA: Insufficient documentation

## 2023-02-27 LAB — CBC WITH DIFFERENTIAL/PLATELET
Abs Immature Granulocytes: 0.01 10*3/uL (ref 0.00–0.07)
Basophils Absolute: 0 10*3/uL (ref 0.0–0.1)
Basophils Relative: 1 %
Eosinophils Absolute: 0 10*3/uL (ref 0.0–0.5)
Eosinophils Relative: 0 %
HCT: 39 % (ref 36.0–46.0)
Hemoglobin: 12.4 g/dL (ref 12.0–15.0)
Immature Granulocytes: 0 %
Lymphocytes Relative: 25 %
Lymphs Abs: 1.1 10*3/uL (ref 0.7–4.0)
MCH: 28.6 pg (ref 26.0–34.0)
MCHC: 31.8 g/dL (ref 30.0–36.0)
MCV: 90.1 fL (ref 80.0–100.0)
Monocytes Absolute: 0.4 10*3/uL (ref 0.1–1.0)
Monocytes Relative: 10 %
Neutro Abs: 2.8 10*3/uL (ref 1.7–7.7)
Neutrophils Relative %: 64 %
Platelets: 239 10*3/uL (ref 150–400)
RBC: 4.33 MIL/uL (ref 3.87–5.11)
RDW: 14.7 % (ref 11.5–15.5)
WBC: 4.3 10*3/uL (ref 4.0–10.5)
nRBC: 0 % (ref 0.0–0.2)

## 2023-02-27 LAB — COMPREHENSIVE METABOLIC PANEL
ALT: 12 U/L (ref 0–44)
AST: 20 U/L (ref 15–41)
Albumin: 4.2 g/dL (ref 3.5–5.0)
Alkaline Phosphatase: 69 U/L (ref 38–126)
Anion gap: 12 (ref 5–15)
BUN: 8 mg/dL (ref 6–20)
CO2: 19 mmol/L — ABNORMAL LOW (ref 22–32)
Calcium: 9.1 mg/dL (ref 8.9–10.3)
Chloride: 106 mmol/L (ref 98–111)
Creatinine, Ser: 0.77 mg/dL (ref 0.44–1.00)
GFR, Estimated: >60 mL/min (ref >60)
Glucose, Bld: 87 mg/dL (ref 70–99)
Potassium: 3.7 mmol/L (ref 3.5–5.1)
Sodium: 137 mmol/L (ref 135–145)
Total Bilirubin: 0.5 mg/dL (ref 0.3–1.2)
Total Protein: 7.6 g/dL (ref 6.5–8.1)

## 2023-02-27 LAB — URINALYSIS, ROUTINE W REFLEX MICROSCOPIC
Bacteria, UA: NONE SEEN
Bilirubin Urine: NEGATIVE
Glucose, UA: NEGATIVE mg/dL
Ketones, ur: 80 mg/dL — AB
Leukocytes,Ua: NEGATIVE
Nitrite: NEGATIVE
Protein, ur: NEGATIVE mg/dL
Specific Gravity, Urine: 1.018 (ref 1.005–1.030)
pH: 5 (ref 5.0–8.0)

## 2023-02-27 LAB — HCG, SERUM, QUALITATIVE: Preg, Serum: NEGATIVE

## 2023-02-27 LAB — SARS CORONAVIRUS 2 BY RT PCR: SARS Coronavirus 2 by RT PCR: POSITIVE — AB

## 2023-02-27 NOTE — ED Triage Notes (Addendum)
Patient reports intermittent dry cough with fatigue , left ear ache and body aches this week , also requesting pregnancy test . She adds positive home Covid test.

## 2023-02-28 NOTE — ED Notes (Addendum)
Patient states that her daughter is with her and she is ready for bed. Advised patient to stay. She states she might as well go to urgent care. I told her to check mychart but follow up with a provider.

## 2023-03-18 ENCOUNTER — Encounter: Payer: Self-pay | Admitting: Internal Medicine

## 2023-03-18 ENCOUNTER — Encounter: Payer: Medicaid Other | Admitting: Internal Medicine

## 2023-03-18 NOTE — Progress Notes (Deleted)
   CC: follow up/requesting UPT  HPI:  [name] is a 27 y.o. with medical history of MDD, Seizures presenting to [facility] for follow up and need for urine pregnancy test.   Please see problem-based list for further details, assessments, and plans.  Past Medical History:  Diagnosis Date   Allergy    seasonal allergies   Eclampsia 09/02/2018   GERD (gastroesophageal reflux disease)    Gonorrhea 12/29/2019   History of heavy periods    HPV (human papilloma virus) infection    Ileus ([facility]) 09/04/2018   Menorrhagia 12/29/2019   Other seasonal allergic rhinitis 01/12/2014   Polyuria 08/07/2022   Severe preeclampsia, third trimester 09/02/2018   Tachycardia, unspecified 08/07/2022       Current Outpatient Medications (Analgesics):    acetaminophen (TYLENOL) 325 MG tablet, Take 2 tablets (650 mg total) by mouth every 6 (six) hours as needed.   Current Outpatient Medications (Other):    dicyclomine (BENTYL) 10 MG capsule, Take 1 capsule (10 mg total) by mouth 4 (four) times daily -  before meals and at bedtime.   emtricitabine-tenofovir AF (DESCOVY) 200-25 MG tablet, Take 1 tablet by mouth daily.   levETIRAcetam (KEPPRA) 250 MG tablet, Take 2 tablets (500 mg total) by mouth daily.   sertraline (ZOLOFT) 25 MG tablet, Take 1 tablet (25 mg total) by mouth daily.  Review of Systems:  Review of system negative unless stated in the problem list or HPI.    Physical Exam:  There were no vitals filed for this visit. Physical Exam General: NAD HENT: NCAT Lungs: CTAB, no wheeze, rhonchi or rales.  Cardiovascular: Normal heart sounds, no r/m/g, 2+ pulses in all extremities. No LE edema Abdomen: No TTP, normal bowel sounds MSK: No asymmetry or muscle atrophy.  Skin: no lesions noted on exposed skin Neuro: Alert and oriented x4. CN grossly intact Psych: Normal mood and normal affect   Assessment & Plan:   No problem-specific Assessment & Plan notes found for this  encounter.   See Encounters Tab for problem based charting.  Patient Discussed with Dr. {NAMES:[id number]::"Guilloud","Hoffman","Mullen","Narendra","Vincent","Machen","Lau","Hatcher","Williams"} Gwenevere Abbot, MD Eligha Bridegroom. [facility] Internal Medicine Residency, PGY-3

## 2023-04-10 IMAGING — US US BREAST*R* LIMITED INC AXILLA
1 series · 5 of 5 positions shown · non-contrast
Comparison: Previous exam(s).
COMPARISON: Previous exam(s).

Addendum:
CLINICAL DATA: 24-year-old female with a painful right breast lump.
History of benign right breast biopsy demonstrating fibroadenoma.

EXAM:
ULTRASOUND OF THE RIGHT BREAST

[Series 1: us breast*right* limited inc axilla · 0.06mm/px · 5 of 5 slices shown]
[im 1/5]
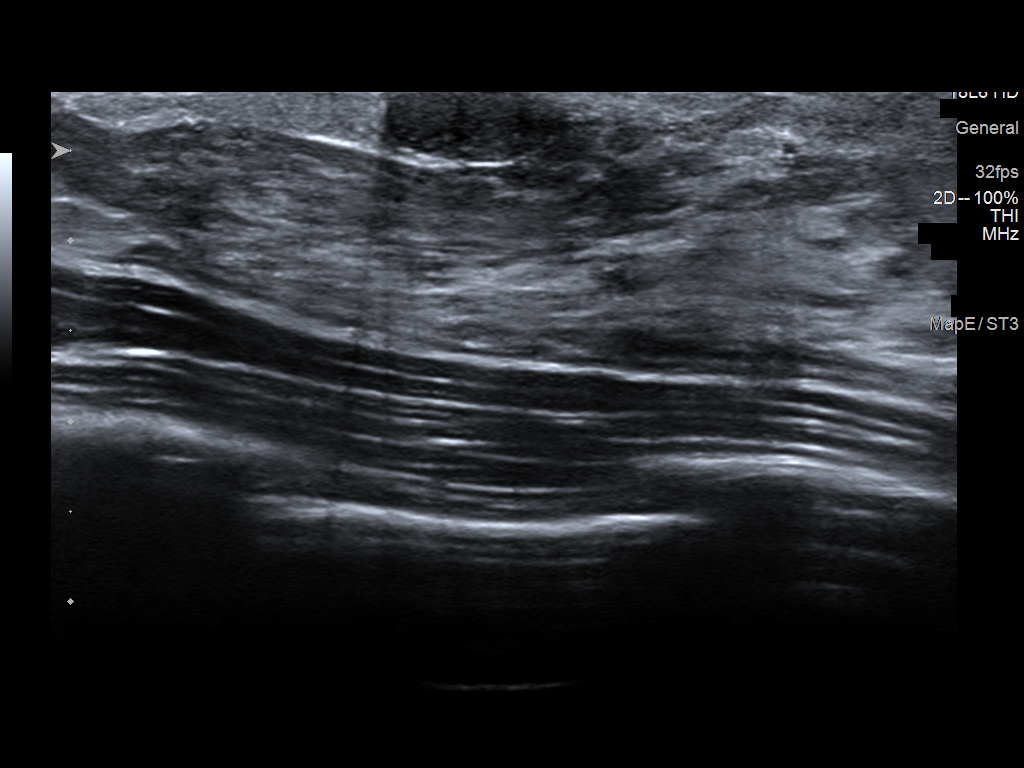
[im 2/5]
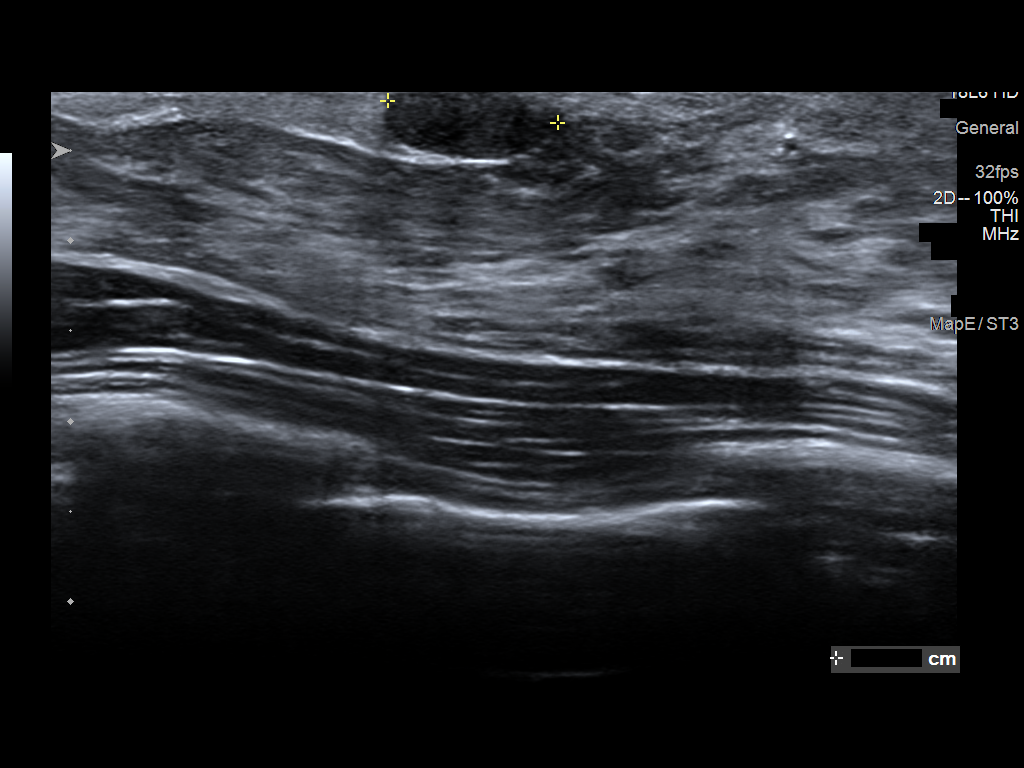
[im 3/5]
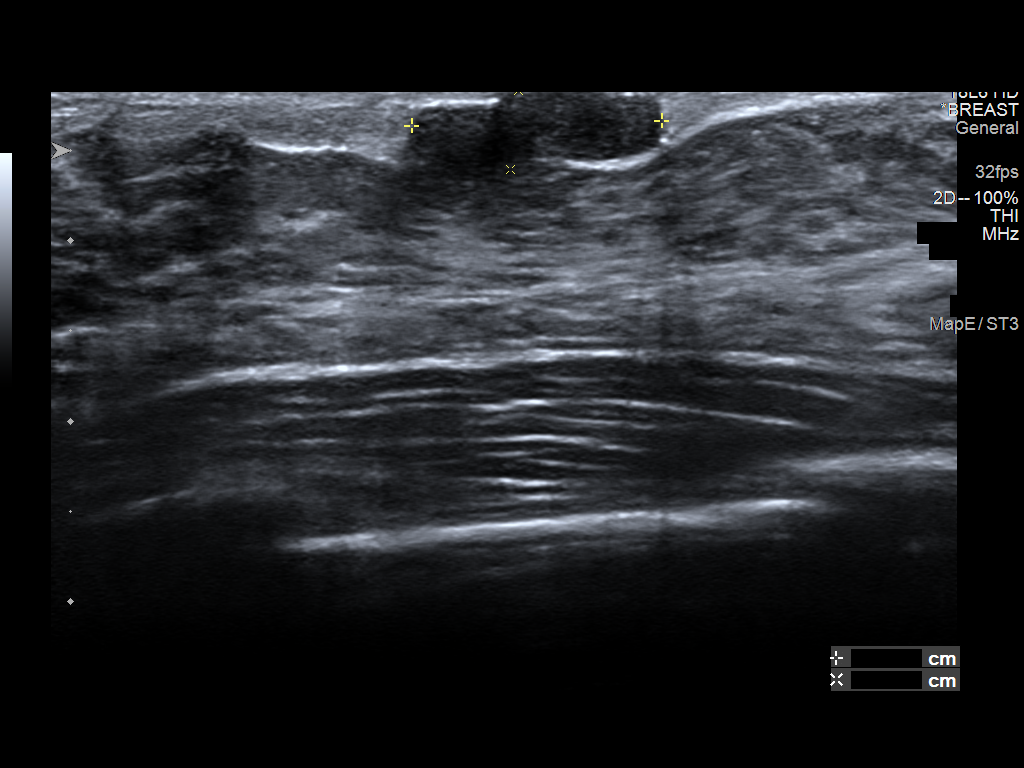
[im 4/5]
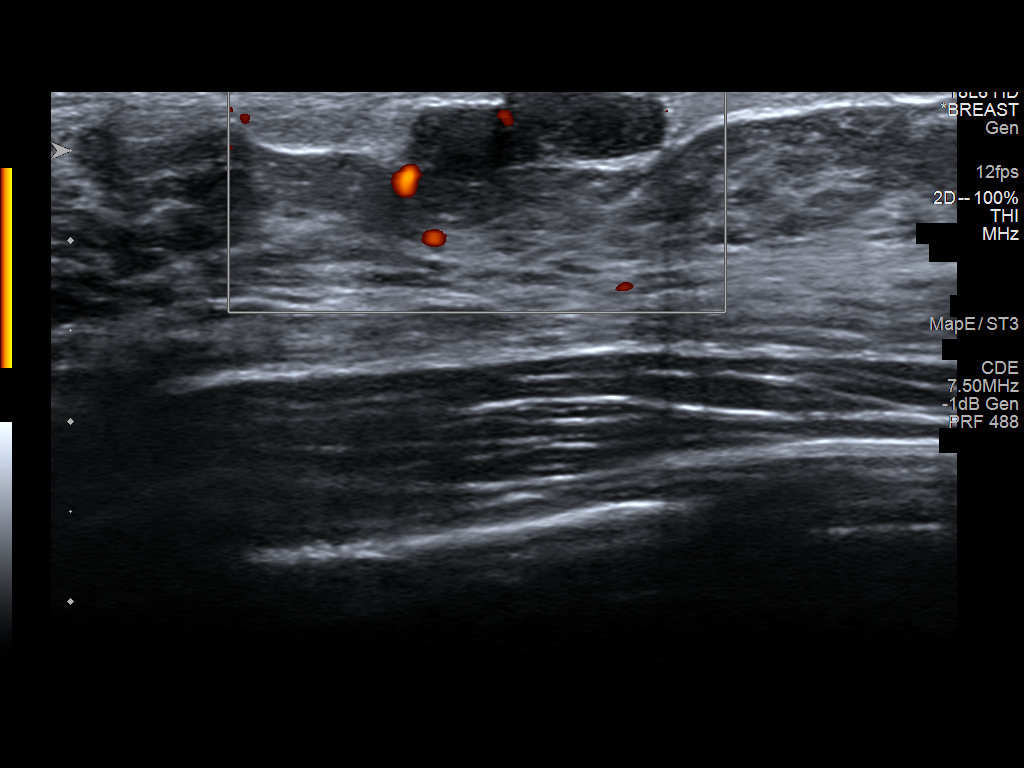
[im 5/5]
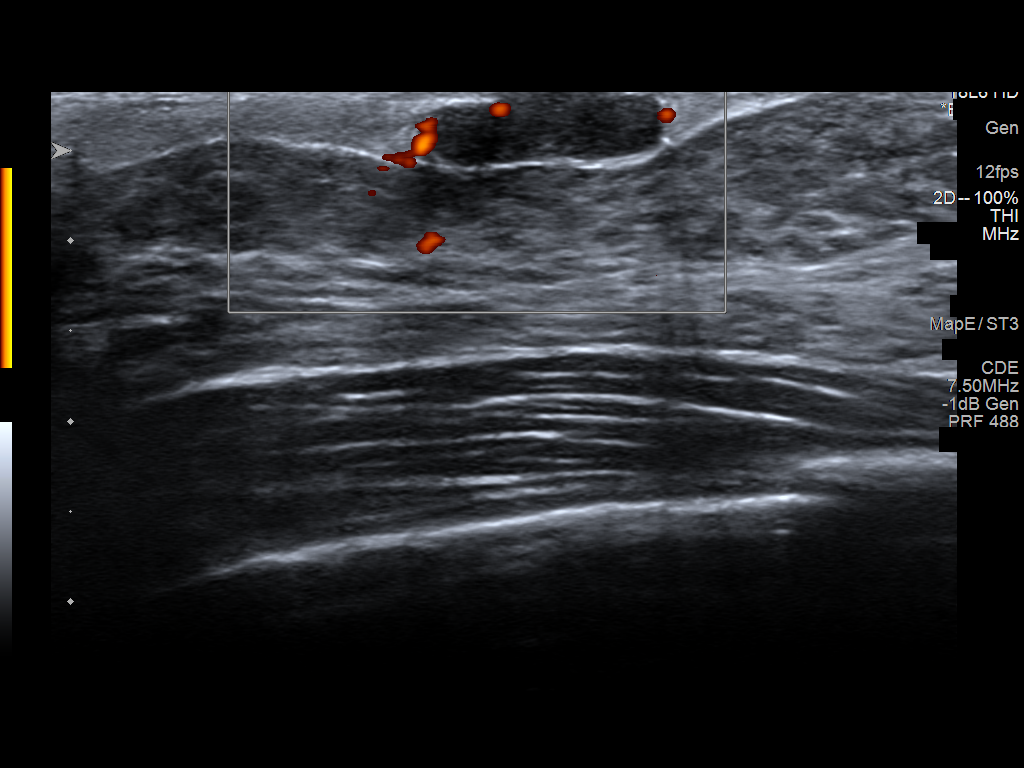

[5 of 5 positions shown; findings below may reference images not displayed]

FINDINGS: Targeted ultrasound is performed, showing stable appearance of a
previously biopsied fibroadenoma at the [DATE] position 6 cm from the
nipple. It measures 1.4 x 0.4 x 1.0 cm (previously 1.2 x 0.6 x
cm).
IMPRESSION: Stable, biopsy proven right breast fibroadenoma.

RECOMMENDATION:
The patient states this mass is causing enough pain to interfere
with her day-to-day ability to work. Further options for surgical
consultation were discussed. She would like to proceed with surgical
consultation for possible excision.

I have discussed the findings and recommendations with the patient.
If applicable, a reminder letter will be sent to the patient
regarding the next appointment.

BI-RADS CATEGORY  2: Benign.

ADDENDUM:
Surgical consultation has been arranged with Dr. Mfedal Thami
at [REDACTED] on May 23, 2021.

Hexnoo Sablatnik RN on 04/30/2021

*** End of Addendum ***
FINDINGS: Targeted ultrasound is performed, showing stable appearance of a
previously biopsied fibroadenoma at the [DATE] position 6 cm from the
nipple. It measures 1.4 x 0.4 x 1.0 cm (previously 1.2 x 0.6 x
cm).
IMPRESSION: Stable, biopsy proven right breast fibroadenoma.

RECOMMENDATION:
The patient states this mass is causing enough pain to interfere
with her day-to-day ability to work. Further options for surgical
consultation were discussed. She would like to proceed with surgical
consultation for possible excision.

I have discussed the findings and recommendations with the patient.
If applicable, a reminder letter will be sent to the patient
regarding the next appointment.

BI-RADS CATEGORY  2: Benign.

## 2023-07-29 ENCOUNTER — Ambulatory Visit: Payer: Medicaid Other | Admitting: Student

## 2023-07-29 ENCOUNTER — Encounter: Payer: Self-pay | Admitting: Student

## 2023-07-29 VITALS — BP 91/73 | HR 82 | Temp 98.2°F | Ht 62.0 in | Wt 97.0 lb

## 2023-07-29 DIAGNOSIS — R569 Unspecified convulsions: Secondary | ICD-10-CM | POA: Diagnosis not present

## 2023-07-29 DIAGNOSIS — G44209 Tension-type headache, unspecified, not intractable: Secondary | ICD-10-CM | POA: Insufficient documentation

## 2023-07-29 DIAGNOSIS — F32A Depression, unspecified: Secondary | ICD-10-CM

## 2023-07-29 DIAGNOSIS — Z8619 Personal history of other infectious and parasitic diseases: Secondary | ICD-10-CM | POA: Diagnosis not present

## 2023-07-29 DIAGNOSIS — G44201 Tension-type headache, unspecified, intractable: Secondary | ICD-10-CM | POA: Diagnosis present

## 2023-07-29 MED ORDER — SERTRALINE HCL 25 MG PO TABS
25.0000 mg | ORAL_TABLET | Freq: Every day | ORAL | 5 refills | Status: AC
Start: 2023-07-29 — End: ?

## 2023-07-29 MED ORDER — LEVETIRACETAM 250 MG PO TABS: 500.0000 mg | ORAL_TABLET | Freq: Every day | ORAL | 0 refills | Status: DC

## 2023-07-29 MED ORDER — DESCOVY 200-25 MG PO TABS
1.0000 | ORAL_TABLET | Freq: Every day | ORAL | 7 refills | Status: DC
Start: 2023-07-29 — End: 2024-04-17

## 2023-07-29 NOTE — Patient Instructions (Signed)
Thank you so much for coming to the clinic today!   For your headaches, I think we need to increase the amount of water you're drinking. I also recommend taking Ibuprofen 400mg  three times a day consistently. This combination should help alleviate your headaches. If your headaches become debilitating, or you start noticing light sensitivity I want you to call the clinic and make an appointment to come back.   For your sleep, what could work is over the counter melatonin.   If you have any questions please feel free to the call the clinic at anytime at [phone]. It was a pleasure seeing you!  Best, Dr. Thomasene Ripple

## 2023-07-29 NOTE — Assessment & Plan Note (Signed)
On PREP with Descovy, doing well, will refill

## 2023-07-29 NOTE — Assessment & Plan Note (Signed)
She has not had any recent seizures, and takes keppra. Will refill.

## 2023-07-29 NOTE — Assessment & Plan Note (Signed)
Patient presents with headache for 2 days straight.  She describes it as localized pressure behind her eyes.  She states that she has had episodes of headaches like this, however they have never lasted this long.  She denies any vision changes, light sensitivity, recent fevers, chills, nausea, vomiting.  She denies any recent congestion.  No hearing loss or neurological deficits.  She states that she only drinks 1 to 2 cups of water daily.  Thankfully, these headaches are not debilitating and she is still able to go to her job and take care of her daughter.  Overall, symptoms seem consistent with tension headaches.  I do not think she is having migraines at this time, as there is no aura.  I do think that she is very dehydrated as evidenced by her blood pressure and also her stating that she only drinks 1 cup of water a day.  Recommended to patient to increase fluid intake, as well as take ibuprofen 400 mg 3 times a day for only 3 to 4 days.    Instructed patient that if the headaches get worse or she starts noticing changes in her vision or has aura symptoms to make an appointment return to the clinic.  Plan:  - OTC Ibuprofen 400mg  TID for 3 days  - Increase fluid intake

## 2023-07-29 NOTE — Progress Notes (Signed)
CC: Headache  HPI:  Victoria Turner is a 28 y.o. female living with a history stated below and presents today for headache. Please see problem based assessment and plan for additional details.  Past Medical History:  Diagnosis Date   Allergy    seasonal allergies   Eclampsia 09/02/2018   GERD (gastroesophageal reflux disease)    Gonorrhea 12/29/2019   History of heavy periods    HPV (human papilloma virus) infection    Ileus (HCC) 09/04/2018   Menorrhagia 12/29/2019   Other seasonal allergic rhinitis 01/12/2014   Polyuria 08/07/2022   Severe preeclampsia, third trimester 09/02/2018   Tachycardia, unspecified 08/07/2022    Current Outpatient Medications on File Prior to Visit  Medication Sig Dispense Refill   acetaminophen (TYLENOL) 325 MG tablet Take 2 tablets (650 mg total) by mouth every 6 (six) hours as needed. 36 tablet 0   dicyclomine (BENTYL) 10 MG capsule Take 1 capsule (10 mg total) by mouth 4 (four) times daily -  before meals and at bedtime. 28 capsule 0   [DISCONTINUED] norethindrone (MICRONOR,CAMILA,ERRIN) 0.35 MG tablet Take 1 tablet (0.35 mg total) by mouth daily. (Patient not taking: Reported on 08/10/2019) 1 Package 11   No current facility-administered medications on file prior to visit.    Family History  Problem Relation Age of Onset   Hypertension Mother    Autism Sister    Autism Brother    Autism Sister    Autism Sister    ADD / ADHD Sister     Social History   Socioeconomic History   Marital status: Single    Spouse name: Not on file   Number of children: Not on file   Years of education: Not on file   Highest education level: Not on file  Occupational History   Not on file  Tobacco Use   Smoking status: Some Days    Types: Cigarettes   Smokeless tobacco: Never  Vaping Use   Vaping status: Some Days  Substance and Sexual Activity   Alcohol use: Never   Drug use: Yes    Types: Marijuana   Sexual activity: Not on file   Other Topics Concern   Not on file  Social History Narrative   ** Merged History Encounter **       In foster (kinship) care with her biological paternal aunt and 3 of her 7 siblings   Social Drivers of Corporate investment banker Strain: Low Risk  (07/29/2023)   Overall Financial Resource Strain (CARDIA)    Difficulty of Paying Living Expenses: Not very hard  Food Insecurity: No Food Insecurity (07/29/2023)   Hunger Vital Sign    Worried About Running Out of Food in the Last Year: Never true    Ran Out of Food in the Last Year: Never true  Transportation Needs: No Transportation Needs (07/29/2023)   PRAPARE - Administrator, Civil Service (Medical): No    Lack of Transportation (Non-Medical): No  Physical Activity: Inactive (07/29/2023)   Exercise Vital Sign    Days of Exercise per Week: 0 days    Minutes of Exercise per Session: 0 min  Stress: No Stress Concern Present (07/29/2023)   [employer] of Occupational Health - Occupational Stress Questionnaire    Feeling of Stress : Not at all  Social Connections: Socially Isolated (07/29/2023)   Social Connection and Isolation Panel [NHANES]    Frequency of Communication with Friends and Family: Once a week  Frequency of Social Gatherings with Friends and Family: Never    Attends Religious Services: More than 4 times per year    Active Member of [employer] or Organizations: No    Attends Banker Meetings: Never    Marital Status: Never married  Intimate Partner Violence: Not At Risk (07/29/2023)   Humiliation, Afraid, Rape, and Kick questionnaire    Fear of Current or Ex-Partner: No    Emotionally Abused: No    Physically Abused: No    Sexually Abused: No    Review of Systems: ROS negative except for what is noted on the assessment and plan.  Vitals:   07/29/23 1456  BP: 91/73  Pulse: 82  Temp: 98.2 F (36.8 C)  TempSrc: Oral  SpO2: 100%  Weight: 97 lb (44 kg)  Height: 5\' 2"  (1.575 m)     Physical Exam: Constitutional: well-appearing female  in no acute distress HENT: normocephalic atraumatic, mucous membranes moist Eyes: conjunctiva non-erythematous,  Neck: supple Cardiovascular: regular rate and rhythm, no m/r/g Pulmonary/Chest: normal work of breathing on room air, lungs clear to auscultation bilaterally Neurological: alert & oriented x 3, 5/5 strength in bilateral upper and lower extremities, normal gait, CN II-XII in tact. Skin: decreased skin turgor Psych: normal mood and affect  Assessment & Plan:   Acute tension headache Patient presents with headache for 2 days straight.  She describes it as localized pressure behind her eyes.  She states that she has had episodes of headaches like this, however they have never lasted this long.  She denies any vision changes, light sensitivity, recent fevers, chills, nausea, vomiting.  She denies any recent congestion.  No hearing loss or neurological deficits.  She states that she only drinks 1 to 2 cups of water daily.  Thankfully, these headaches are not debilitating and she is still able to go to her job and take care of her daughter.  Overall, symptoms seem consistent with tension headaches.  I do not think she is having migraines at this time, as there is no aura.  I do think that she is very dehydrated as evidenced by her blood pressure and also her stating that she only drinks 1 cup of water a day.  Recommended to patient to increase fluid intake, as well as take ibuprofen 400 mg 3 times a day for only 3 to 4 days.    Instructed patient that if the headaches get worse or she starts noticing changes in her vision or has aura symptoms to make an appointment return to the clinic.  Plan:  - OTC Ibuprofen 400mg  TID for 3 days  - Increase fluid intake    Seizures (HCC) She has not had any recent seizures, and takes keppra. Will refill.   History of sexually transmitted disease On PREP with Descovy, doing well, will  refill  Patient discussed with Dr. Maryagnes Amos Harwood Nall, M.D. [facility] Internal Medicine, PGY-2 Pager: [phone] Date 07/29/2023 Time 4:50 PM

## 2023-07-30 NOTE — Progress Notes (Signed)
 Internal Medicine Clinic Attending  Case discussed with the resident physician at the time of the visit.  We reviewed the patient's history, exam, and pertinent patient test results.  I agree with the assessment, diagnosis, and plan of care documented in the resident's note.

## 2023-12-25 ENCOUNTER — Emergency Department (HOSPITAL_COMMUNITY)
Admission: EM | Admit: 2023-12-25 | Discharge: 2023-12-25 | Disposition: A | Discharge status: Home / Self Care | Attending: Emergency Medicine | Admitting: Emergency Medicine

## 2023-12-25 ENCOUNTER — Encounter (HOSPITAL_COMMUNITY): Payer: Self-pay

## 2023-12-25 ENCOUNTER — Other Ambulatory Visit: Payer: Self-pay

## 2023-12-25 DIAGNOSIS — K0889 Other specified disorders of teeth and supporting structures: Secondary | ICD-10-CM | POA: Diagnosis present

## 2023-12-25 DIAGNOSIS — K047 Periapical abscess without sinus: Secondary | ICD-10-CM

## 2023-12-25 MED ORDER — PENICILLIN V POTASSIUM 500 MG PO TABS: 500.0000 mg | ORAL_TABLET | Freq: Four times a day (QID) | ORAL | 0 refills | Status: AC

## 2023-12-25 MED ORDER — KETOROLAC TROMETHAMINE 15 MG/ML IJ SOLN
15.0000 mg | Freq: Once | INTRAMUSCULAR | Status: AC
  Administered 2023-12-25: 15 mg via INTRAMUSCULAR
  Filled 2023-12-25: qty 1

## 2023-12-25 MED ORDER — NAPROXEN 500 MG PO TABS: 500.0000 mg | ORAL_TABLET | Freq: Two times a day (BID) | ORAL | 0 refills | Status: DC

## 2023-12-25 NOTE — ED Provider Notes (Signed)
 [place] EMERGENCY DEPARTMENT AT [facility] Provider Note   CSN: [id number] Arrival date & time: 12/25/23  2123     Patient presents with: Dental Pain   Victoria Turner is a 28 y.o. female.  Patient presents the emergency room complaint of dental pain.  She complains of dental pain to the lower left jaw.  She endorses a broken tooth after a seizure some days ago and had a half of her tooth broken at that time.  She has not attempted follow-up with dentistry.  She denies systemic symptoms such as fever, nausea, vomiting.    Dental Pain      Prior to Admission medications   Medication Sig Start Date End Date Taking? Authorizing Provider  naproxen  (NAPROSYN ) 500 MG tablet Take 1 tablet (500 mg total) by mouth 2 (two) times daily. 12/25/23  Yes Elisa Guest, PA-C  penicillin  v potassium (VEETID) 500 MG tablet Take 1 tablet (500 mg total) by mouth 4 (four) times daily for 10 days. 12/25/23 01/04/24 Yes Elisa Guest, PA-C  acetaminophen  (TYLENOL ) 325 MG tablet Take 2 tablets (650 mg total) by mouth every 6 (six) hours as needed. 11/11/21   Teddi Favors, DO  dicyclomine  (BENTYL ) 10 MG capsule Take 1 capsule (10 mg total) by mouth 4 (four) times daily -  before meals and at bedtime. 05/19/22   Raspet, Erin K, PA-C  emtricitabine -tenofovir  AF (DESCOVY ) 200-25 MG tablet Take 1 tablet by mouth daily. 07/29/23   Nooruddin, Saad, MD  levETIRAcetam  (KEPPRA ) 250 MG tablet Take 2 tablets (500 mg total) by mouth daily. 07/29/23   Nooruddin, Saad, MD  sertraline  (ZOLOFT ) 25 MG tablet Take 1 tablet (25 mg total) by mouth daily. 07/29/23   Nooruddin, Saad, MD  norethindrone  (MICRONOR ,CAMILA ,ERRIN ) 0.35 MG tablet Take 1 tablet (0.35 mg total) by mouth daily. Patient not taking: Reported on 08/10/2019 10/06/18 11/18/19  Jan Mcgill, MD    Allergies: Bee pollen and Pollen extract    Review of Systems  Updated Vital Signs BP (!) 123/92 (BP Location: Left Arm)   Pulse 75    Temp 98.4 F (36.9 C) (Oral)   Resp 20   SpO2 100%   Physical Exam Vitals and nursing note reviewed.  HENT:     Head: Normocephalic and atraumatic.     Mouth/Throat:    Eyes:     Conjunctiva/sclera: Conjunctivae normal.    Cardiovascular:     Rate and Rhythm: Normal rate.  Pulmonary:     Effort: Pulmonary effort is normal. No respiratory distress.   Musculoskeletal:        General: No signs of injury.     Cervical back: Normal range of motion.   Skin:    General: Skin is dry.   Neurological:     Mental Status: She is alert.   Psychiatric:        Speech: Speech normal.        Behavior: Behavior normal.     (all labs ordered are listed, but only abnormal results are displayed) Labs Reviewed - No data to display  EKG: None  Radiology: No results found.   Procedures   Medications Ordered in the ED  ketorolac  (TORADOL ) 15 MG/ML injection 15 mg (has no administration in time range)                                    Medical Decision Making  Risk Prescription drug management.   Patient presents emergency room complaining of a painful/broken tooth.  Differential includes broken tooth, cavity, dental infection, others  There is no indication at this time for laboratory work or imaging.  Based on physical examination the patient does have a broken tooth in the lower left jaw with some surrounding erythema.  No drainable abscess appreciated.  I ordered the patient Toradol  for inflammation.  Upon reassessment the patient had improved.  Plan to discharge home with prescription for Naprosyn  and penicillin  for antibiotic coverage.  Patient informed that it is very important that she follow-up with dentistry for definitive management.  Patient voices understanding with plan.  No indication for further emergent workup or admission.  Discharge home.     Final diagnoses:  Dental infection    ED Discharge Orders          Ordered    naproxen  (NAPROSYN ) 500 MG  tablet  2 times daily        12/25/23 2238    penicillin  v potassium (VEETID) 500 MG tablet  4 times daily        12/25/23 2238               Delories Fetter 12/25/23 2241    Afton Horse T, DO 12/25/23 2332

## 2023-12-25 NOTE — Discharge Instructions (Addendum)
 You were evaluated today for tooth pain.  You have a broken tooth will need to see dentistry for further evaluation and definitive management.  I have prescribed a short course of antibiotics and some anti-inflammatory medication to be taken as directed.

## 2023-12-25 NOTE — ED Triage Notes (Addendum)
 Pt came in for left dental pain for three days now. Pt stated she had a previous seizure and half of her tooth was chipped off.

## 2024-04-17 ENCOUNTER — Ambulatory Visit (HOSPITAL_COMMUNITY)
Admission: EM | Admit: 2024-04-17 | Discharge: 2024-04-17 | Disposition: A | Discharge status: Home / Self Care | Attending: Emergency Medicine | Admitting: Emergency Medicine

## 2024-04-17 ENCOUNTER — Other Ambulatory Visit: Payer: Self-pay

## 2024-04-17 ENCOUNTER — Encounter (HOSPITAL_COMMUNITY): Payer: Self-pay | Admitting: *Deleted

## 2024-04-17 DIAGNOSIS — L03115 Cellulitis of right lower limb: Secondary | ICD-10-CM

## 2024-04-17 MED ORDER — DOXYCYCLINE HYCLATE 100 MG PO TABS: 100.0000 mg | ORAL_TABLET | Freq: Two times a day (BID) | ORAL | 0 refills | Status: AC

## 2024-04-17 NOTE — ED Triage Notes (Signed)
 PT has a bruise on RT calf. Pt states she does not remember a injury to leg. Pt reports she first saw bruise last week and the bruise is now bigger. Pt reports the site itches and is painful. PT has not tried any OTC for med.

## 2024-04-17 NOTE — Discharge Instructions (Addendum)
 I am concerned that the back of your calf may be infected.  Will do warm compresses to the area daily.  Take the antibiotics 2 times daily with food for the next 7 days.    Return to clinic or follow-up with your primary care provider if the area does not shrink or improve, if you develop fever, or bruising over your body.

## 2024-04-17 NOTE — ED Provider Notes (Signed)
 [facility]    CSN: [id number] Arrival date & time: 04/17/24  1633      History   Chief Complaint Chief Complaint  Patient presents with   Leg Injury    HPI Victoria Turner is a 28 y.o. female.   Patient presents to clinic over concern of an area to the back of her left calf that has been present for about a week now.  Initially she thought the area was a bruise.  Feels like it has grown over the past week and has a nodular area when palpated deeper.  It is tender when she walks.  It is slightly itchy on the top.  Has tried topical Neosporin without improvement.  Reports she does usually bruise easily, no current bruises.  No history of anemia.  Has not had any fevers.  No drainage from the site.  The history is provided by the patient and medical records.    Past Medical History:  Diagnosis Date   Allergy    seasonal allergies   Eclampsia 09/02/2018   GERD (gastroesophageal reflux disease)    Gonorrhea 12/29/2019   History of heavy periods    HPV (human papilloma virus) infection    Ileus (HCC) 09/04/2018   Menorrhagia 12/29/2019   Other seasonal allergic rhinitis 01/12/2014   Polyuria 08/07/2022   Severe preeclampsia, third trimester 09/02/2018   Tachycardia, unspecified 08/07/2022    Patient Active Problem List   Diagnosis Date Noted   Acute tension headache 07/29/2023   Encounter for pregnancy test 08/07/2022   Adnexal mass 08/01/2022   BRBPR (bright red blood per rectum) 08/01/2022   Breast lump on right side at 12 o'clock position 05/04/2020   Seizures (HCC) 12/29/2019   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 Authorizing Provider  doxycycline  (VIBRA -TABS) 100 MG tablet Take 1 tablet (100 mg total) by mouth 2 (two) times daily for 7 days. 04/17/24 04/24/24 Yes Shakeema Lippman  N, FNP  sertraline  (ZOLOFT ) 25 MG tablet Take 1 tablet (25 mg total) by mouth daily. 07/29/23  Yes Nooruddin, Saad, MD  levETIRAcetam  (KEPPRA ) 250 MG tablet Take 2 tablets (500 mg total) by mouth daily. 07/29/23   Nooruddin, Saad, MD  norethindrone  (MICRONOR ,CAMILA ,ERRIN ) 0.35 MG tablet Take 1 tablet (0.35 mg total) by mouth daily. Patient not taking: Reported on 08/10/2019 10/06/18 11/18/19  Nicholaus Burnard HERO, MD    Family History Family History  Problem Relation Age of Onset   Hypertension Mother    Autism Sister    Autism Brother    Autism Sister    Autism Sister    ADD / ADHD Sister     Social History Social History   Tobacco Use  Smoking status: Some Days    Types: Cigarettes   Smokeless tobacco: Never  Vaping Use   Vaping status: Some Days  Substance Use Topics   Alcohol use: Never   Drug use: Yes    Types: Marijuana     Allergies   Bee pollen and Pollen extract   Review of Systems Review of Systems  Per HPI  Physical Exam Triage Vital Signs ED Triage Vitals  Encounter Vitals Group     BP 04/17/24 1751 97/74     Girls Systolic BP Percentile --      Girls Diastolic BP Percentile --      Boys Systolic BP Percentile --      Boys Diastolic BP Percentile --      Pulse Rate 04/17/24 1751 79     Resp 04/17/24 1751 18     Temp 04/17/24 1751 98.2 F (36.8 C)     Temp src --      SpO2 04/17/24 1751 97 %     Weight --      Height --      Head Circumference --      Peak Flow --      Pain Score 04/17/24 1748 8     Pain Loc --      Pain Education --      Exclude from Growth  Chart --    No data found.  Updated Vital Signs BP 97/74   Pulse 79   Temp 98.2 F (36.8 C)   Resp 18   LMP 04/13/2024   SpO2 97%   Visual Acuity Right Eye Distance:   Left Eye Distance:   Bilateral Distance:    Right Eye Near:   Left Eye Near:    Bilateral Near:     Physical Exam Vitals and nursing note reviewed.  Constitutional:      Appearance: Normal appearance.  HENT:     Head: Normocephalic and atraumatic.     Right Ear: External ear normal.     Left Ear: External ear normal.     Nose: Nose normal.     Mouth/Throat:     Mouth: Mucous membranes are moist.  Eyes:     Conjunctiva/sclera: Conjunctivae normal.  Cardiovascular:     Rate and Rhythm: Normal rate.  Pulmonary:     Effort: Pulmonary effort is normal. No respiratory distress.  Musculoskeletal:        General: Normal range of motion.  Skin:    General: Skin is warm and dry.      Neurological:     General: No focal deficit present.     Mental Status: She is alert and oriented to person, place, and time.  Psychiatric:        Mood and Affect: Mood normal.        Behavior: Behavior normal.      UC Treatments / Results  Labs (all labs ordered are listed, but only abnormal results are displayed) Labs Reviewed - No data to display  EKG   Radiology No results found.  Procedures Procedures (including critical care time)  Medications Ordered in UC Medications - No data to display  Initial Impression / Assessment and Plan / UC Course  I have reviewed the triage vital signs and the nursing notes.  Pertinent labs & imaging results that were available during my care of the patient were reviewed by me and considered in my medical decision making (see chart for details).  Vitals and triage reviewed, patient  is hemodynamically stable.  Hyperpigmented area with nodular indurated area that is filled with palpation appears to be consistent with cellulitis, will treat with doxycycline .  Less likely  bruise this area has grown in size over the past week, discussed option of lab work.  Without calf pain to dorsiflexion, lower concern for DVT or superficial thrombosis.  Without history of anemia.  Can consider labs if no improvement despite antibiotics.  Plan of care, follow-up care return precautions given, no questions at this time.    Final Clinical Impressions(s) / UC Diagnoses   Final diagnoses:  Cellulitis of right lower extremity     Discharge Instructions      I am concerned that the back of your calf may be infected.  Will do warm compresses to the area daily.  Take the antibiotics 2 times daily with food for the next 7 days.    Return to clinic or follow-up with your primary care provider if the area does not shrink or improve, if you develop fever, or bruising over your body.    ED Prescriptions     Medication Sig Dispense Auth. Provider   doxycycline  (VIBRA -TABS) 100 MG tablet Take 1 tablet (100 mg total) by mouth 2 (two) times daily for 7 days. 14 tablet Dreama, Kaidence Sant  N, FNP      PDMP not reviewed this encounter.   Dreama, Chivon Lepage  N, FNP 04/17/24 1808

## 2024-04-25 ENCOUNTER — Encounter (HOSPITAL_COMMUNITY): Payer: Self-pay

## 2024-04-25 ENCOUNTER — Ambulatory Visit (HOSPITAL_COMMUNITY)
Admission: EM | Admit: 2024-04-25 | Discharge: 2024-04-25 | Disposition: A | Discharge status: Home / Self Care | Attending: Family Medicine | Admitting: Family Medicine

## 2024-04-25 DIAGNOSIS — H1032 Unspecified acute conjunctivitis, left eye: Secondary | ICD-10-CM | POA: Diagnosis not present

## 2024-04-25 MED ORDER — GENTAMICIN SULFATE 0.3 % OP SOLN: 2.0000 [drp] | Freq: Three times a day (TID) | OPHTHALMIC | 0 refills | Status: AC

## 2024-04-25 MED ORDER — LEVETIRACETAM 250 MG PO TABS: 500.0000 mg | ORAL_TABLET | Freq: Every day | ORAL | 0 refills | Status: AC

## 2024-04-25 NOTE — Discharge Instructions (Signed)
 Put gentamicin eyedrops in the affected eye(s) 3 times daily for 5 days.  Levetiracetam  250 mg--take 2 tablets together once daily for seizures.  Please follow-up with your primary care

## 2024-04-25 NOTE — ED Provider Notes (Signed)
 [facility]    CSN: [id number] Arrival date & time: 04/25/24  0806      History   Chief Complaint Chief Complaint  Patient presents with   Eye Problem   Medication Refill    HPI Victoria Turner is a 28 y.o. female.    Eye Problem Medication Refill  Here for left eye irritation and redness and discharge.  She first noted it this morning.  Her eye was crusted shut.  No cough or congestion or fever.  NKDA  She has epilepsy and has been prescribed Keppra .  She realized that she has not been taking it for couple of months.  She had thought that the sertraline  was actually the medication she was supposed to be taking for her seizures.  She did have a seizure about 2 months ago.  Last menstrual cycle was October 8 Past Medical History:  Diagnosis Date   Allergy    seasonal allergies   Eclampsia 09/02/2018   GERD (gastroesophageal reflux disease)    Gonorrhea 12/29/2019   History of heavy periods    HPV (human papilloma virus) infection    Ileus (HCC) 09/04/2018   Menorrhagia 12/29/2019   Other seasonal allergic rhinitis 01/12/2014   Polyuria 08/07/2022   Severe preeclampsia, third trimester 09/02/2018   Tachycardia, unspecified 08/07/2022    Patient Active Problem List   Diagnosis Date Noted   Acute tension headache 07/29/2023   Encounter for pregnancy test 08/07/2022   Adnexal mass 08/01/2022   BRBPR (bright red blood per rectum) 08/01/2022   Breast lump on right side at 12 o'clock position 05/04/2020   Seizures (HCC) 12/29/2019   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 Authorizing Provider  gentamicin (GARAMYCIN) 0.3 % ophthalmic solution Place 2 drops into the left eye 3 (three) times daily for 5 days. 04/25/24 04/30/24 Yes Vonna Sharlet POUR, MD  levETIRAcetam  (KEPPRA ) 250 MG tablet Take 2 tablets (500 mg total) by mouth daily. 04/25/24   Vonna Sharlet POUR, MD  sertraline  (ZOLOFT ) 25 MG tablet Take 1 tablet (25 mg total) by mouth daily. 07/29/23   Nooruddin, Saad, MD  norethindrone  (MICRONOR ,CAMILA ,ERRIN ) 0.35 MG tablet Take 1 tablet (0.35 mg total) by mouth daily. Patient not taking: Reported on 08/10/2019 10/06/18 11/18/19  Nicholaus Burnard HERO, MD    Family History Family History  Problem Relation Age of Onset   Hypertension Mother    Autism Sister    Autism Brother    Autism Sister    Autism Sister    ADD / ADHD Sister     Social History Social History   Tobacco Use   Smoking status: Former    Types: Cigarettes   Smokeless tobacco:  Never  Vaping Use   Vaping status: Some Days  Substance Use Topics   Alcohol use: Never   Drug use: Not Currently    Types: Marijuana     Allergies   Bee pollen and Pollen extract   Review of Systems Review of Systems   Physical Exam Triage Vital Signs ED Triage Vitals [04/25/24 0849]  Encounter Vitals Group     BP 108/76     Girls Systolic BP Percentile      Girls Diastolic BP Percentile      Boys Systolic BP Percentile      Boys Diastolic BP Percentile      Pulse Rate 87     Resp 16     Temp 98.2 F (36.8 C)     Temp Source Oral     SpO2 98 %     Weight      Height      Head Circumference      Peak Flow      Pain Score 3     Pain Loc      Pain Education      Exclude from Growth Chart    No data found.  Updated Vital Signs BP 108/76 (BP Location: Right Arm)   Pulse 87   Temp 98.2 F (36.8 C) (Oral)   Resp  16   LMP 04/13/2024 (Approximate)   SpO2 98%   Visual Acuity Right Eye Distance: 20/25 Left Eye Distance: 20/25 Bilateral Distance: 20/20  Right Eye Near:   Left Eye Near:    Bilateral Near:     Physical Exam Vitals reviewed.  Constitutional:      General: She is not in acute distress.    Appearance: She is not ill-appearing, toxic-appearing or diaphoretic.  HENT:     Mouth/Throat:     Mouth: Mucous membranes are moist.  Eyes:     Extraocular Movements: Extraocular movements intact.     Pupils: Pupils are equal, round, and reactive to light.     Comments: Left conjunctiva is injected.  The lids are not swollen  Cardiovascular:     Rate and Rhythm: Normal rate and regular rhythm.     Heart sounds: No murmur heard. Pulmonary:     Effort: Pulmonary effort is normal.     Breath sounds: Normal breath sounds.  Musculoskeletal:     Cervical back: Neck supple.  Lymphadenopathy:     Cervical: No cervical adenopathy.  Skin:    Coloration: Skin is not jaundiced or pale.  Neurological:     General: No focal deficit present.     Mental Status: She is alert and oriented to person, place, and time.  Psychiatric:        Behavior: Behavior normal.      UC Treatments / Results  Labs (all labs ordered are listed, but only abnormal results are displayed) Labs Reviewed - No data to display  EKG   Radiology No results found.  Procedures Procedures (including critical care time)  Medications Ordered in UC Medications - No data to display  Initial Impression / Assessment and Plan / UC Course  I have reviewed the triage vital signs and the nursing notes.  Pertinent labs & imaging results that were available during my care of the patient were reviewed by me and considered in my medical decision making (see chart for details).     Gentamicin sent in for the conjunctivitis.  1 month supply of the Keppra  is sent in.  I  have asked her to make a follow-up appointment with her  primary care provider  Final Clinical Impressions(s) / UC Diagnoses   Final diagnoses:  Acute conjunctivitis of left eye, unspecified acute conjunctivitis type     Discharge Instructions      Put gentamicin eyedrops in the affected eye(s) 3 times daily for 5 days.  Levetiracetam  250 mg--take 2 tablets together once daily for seizures.  Please follow-up with your primary care     ED Prescriptions     Medication Sig Dispense Auth. Provider   levETIRAcetam  (KEPPRA ) 250 MG tablet Take 2 tablets (500 mg total) by mouth daily. 60 tablet Channon Brougher, Sharlet POUR, MD   gentamicin (GARAMYCIN) 0.3 % ophthalmic solution Place 2 drops into the left eye 3 (three) times daily for 5 days. 5 mL Vonna Sharlet POUR, MD      PDMP not reviewed this encounter.   Vonna Sharlet POUR, MD 04/25/24 [phone]

## 2024-04-25 NOTE — ED Triage Notes (Signed)
 Patient here today with c/o left eye irritation, itching, burning, soreness, and drainage this morning.   Patient is also requesting a refill on her Keppra . Patient goes to [facility] across.

## 2024-06-05 ENCOUNTER — Ambulatory Visit (HOSPITAL_COMMUNITY)

## 2024-06-17 ENCOUNTER — Ambulatory Visit: Payer: Self-pay
